# Patient Record
Sex: Female | Born: 1960 | Race: White | Hispanic: No | Marital: Married | Smoking: Never smoker
Health system: Southern US, Community
[De-identification: ages and names within clinical notes are randomized; demographics above are authoritative.]

## PROBLEM LIST (undated history)

## (undated) DIAGNOSIS — H04123 Dry eye syndrome of bilateral lacrimal glands: Secondary | ICD-10-CM

## (undated) DIAGNOSIS — E039 Hypothyroidism, unspecified: Secondary | ICD-10-CM

## (undated) DIAGNOSIS — J45909 Unspecified asthma, uncomplicated: Secondary | ICD-10-CM

## (undated) DIAGNOSIS — I10 Essential (primary) hypertension: Secondary | ICD-10-CM

## (undated) DIAGNOSIS — M1990 Unspecified osteoarthritis, unspecified site: Secondary | ICD-10-CM

## (undated) HISTORY — PX: KNEE ARTHROSCOPY: SHX127

## (undated) HISTORY — PX: JOINT REPLACEMENT: SHX530

## (undated) HISTORY — PX: BILATERAL CARPAL TUNNEL RELEASE: SHX6508

## (undated) HISTORY — PX: DILATION AND CURETTAGE OF UTERUS: SHX78

## (undated) HISTORY — PX: NOSE SURGERY: SHX723

## (undated) HISTORY — PX: LASIK: SHX215

---

## 2014-10-20 ENCOUNTER — Other Ambulatory Visit: Payer: Self-pay | Admitting: Family Medicine

## 2014-10-20 ENCOUNTER — Ambulatory Visit
Admission: RE | Admit: 2014-10-20 | Discharge: 2014-10-20 | Disposition: A | Discharge status: Home / Self Care | Payer: 59 | Source: Ambulatory Visit | Attending: Family Medicine | Admitting: Family Medicine

## 2014-10-20 DIAGNOSIS — M170 Bilateral primary osteoarthritis of knee: Secondary | ICD-10-CM

## 2015-03-14 ENCOUNTER — Other Ambulatory Visit: Payer: Self-pay

## 2015-03-14 DIAGNOSIS — Z1231 Encounter for screening mammogram for malignant neoplasm of breast: Secondary | ICD-10-CM

## 2015-04-19 ENCOUNTER — Encounter (INDEPENDENT_AMBULATORY_CARE_PROVIDER_SITE_OTHER): Payer: Self-pay

## 2015-04-19 ENCOUNTER — Ambulatory Visit
Admission: RE | Admit: 2015-04-19 | Discharge: 2015-04-19 | Disposition: A | Discharge status: Home / Self Care | Payer: 59 | Source: Ambulatory Visit

## 2015-04-19 DIAGNOSIS — Z1231 Encounter for screening mammogram for malignant neoplasm of breast: Secondary | ICD-10-CM

## 2015-08-17 ENCOUNTER — Ambulatory Visit
Admission: RE | Admit: 2015-08-17 | Discharge: 2015-08-17 | Disposition: A | Discharge status: Home / Self Care | Payer: 59 | Source: Ambulatory Visit | Attending: Rheumatology | Admitting: Rheumatology

## 2015-08-17 ENCOUNTER — Other Ambulatory Visit: Payer: Self-pay | Admitting: Rheumatology

## 2015-08-17 DIAGNOSIS — M545 Low back pain: Secondary | ICD-10-CM

## 2015-09-23 ENCOUNTER — Encounter (HOSPITAL_COMMUNITY): Payer: Self-pay

## 2015-09-23 ENCOUNTER — Emergency Department (HOSPITAL_COMMUNITY): Payer: 59

## 2015-09-23 ENCOUNTER — Emergency Department (HOSPITAL_COMMUNITY)
Admission: EM | Admit: 2015-09-23 | Discharge: 2015-09-23 | Disposition: A | Discharge status: Home / Self Care | Payer: 59 | Attending: Emergency Medicine | Admitting: Emergency Medicine

## 2015-09-23 DIAGNOSIS — Y998 Other external cause status: Secondary | ICD-10-CM | POA: Diagnosis not present

## 2015-09-23 DIAGNOSIS — Y9389 Activity, other specified: Secondary | ICD-10-CM | POA: Insufficient documentation

## 2015-09-23 DIAGNOSIS — Y9289 Other specified places as the place of occurrence of the external cause: Secondary | ICD-10-CM | POA: Diagnosis not present

## 2015-09-23 DIAGNOSIS — S8992XA Unspecified injury of left lower leg, initial encounter: Secondary | ICD-10-CM | POA: Diagnosis not present

## 2015-09-23 DIAGNOSIS — M1990 Unspecified osteoarthritis, unspecified site: Secondary | ICD-10-CM | POA: Insufficient documentation

## 2015-09-23 DIAGNOSIS — W010XXA Fall on same level from slipping, tripping and stumbling without subsequent striking against object, initial encounter: Secondary | ICD-10-CM | POA: Insufficient documentation

## 2015-09-23 DIAGNOSIS — M25562 Pain in left knee: Secondary | ICD-10-CM

## 2015-09-23 HISTORY — DX: Unspecified osteoarthritis, unspecified site: M19.90

## 2015-09-23 MED ORDER — HYDROCODONE-ACETAMINOPHEN 5-325 MG PO TABS: 1.0000 | ORAL_TABLET | ORAL | Status: DC | PRN

## 2015-09-23 NOTE — ED Notes (Signed)
She states she "slipped a little", thereby tweaking her left knee; which remains painful and swollen.  She states she has impending right knee replacement scheduled for Jan. By Dr. Lequita HaltAluisio; and will ultimately need total left knee surgery also, though no date has been set.

## 2015-09-23 NOTE — Progress Notes (Signed)
Pt stated,"I have bad knees and am having knee replacements starting in January. " Pt stated she was walking yesterday and she felt her knee make a funny noise and now has pain with ambulation.Family remains at the bedside with the pt. Pt stated her pain is a 7/10 when standing and only a 2/10 when sitting.

## 2015-09-23 NOTE — ED Provider Notes (Signed)
CSN: [id number]     Arrival date & time 09/23/15  1512 History  By signing my name below, I, Rachael Shelton, attest that this documentation has been prepared under the direction and in the presence of Marlon Pel, PA-C. Electronically Signed: Elon Shelton ED Scribe. 09/23/2015. 4:52 PM.   Chief Complaint  Patient presents with  . Knee Pain   The history is provided by the patient. No language interpreter was used.   HPI Comments: Rachael Shelton is a 54 y.o. female who presents to the Emergency Department complaining of an exacerbation of her constant, moderate, dull left knee pain yesterday after slipping forward slightly.  The complaint worsened overnight and has improved throughout the day.  She has used diclofenac and ice without relief.  She is followed by [facility], Dr. Despina Hick for her chronic knee pain and reports that she was told to come to the ED today to since Dr. Ranell Patrick is already here seeing another patient and that she might be able to get her Cortisone shot.  ROS: The patient denies diaphoresis, fever, headache, weakness (general or focal), confusion, change of vision,  dysphagia, aphagia, shortness of breath,  abdominal pains, nausea, vomiting, diarrhea,rash, neck pain, chest pain   Past Medical History  Diagnosis Date  . Arthritis    No past surgical history on file. No family history on file. Social History  Substance Use Topics  . Smoking status: Never Smoker   . Smokeless tobacco: Not on file  . Alcohol Use: No   OB History    No data available     Review of Systems 10 Systems reviewed and are negative for acute change except as noted in the HPI.    Allergies  Review of patient's allergies indicates no known allergies.  Home Medications   Prior to Admission medications   Medication Sig Start Date End Date Taking? Authorizing Provider  HYDROcodone-acetaminophen (NORCO/VICODIN) 5-325 MG tablet Take 1 tablet by mouth every 4 (four) hours as  needed. 09/23/15   Moody Robben Neva Seat, PA-C   BP 138/76 mmHg  Pulse 76  Temp(Src) 98.1 F (36.7 C) (Oral)  Ht  (1.549 m)  Wt 220 lb (99.791 kg)  BMI 41.59 kg/m2  SpO2 98%  LMP 09/20/2015 Physical Exam  Constitutional: She is oriented to person, place, and time. She appears well-developed and well-nourished. No distress.  HENT:  Head: Normocephalic and atraumatic.  Eyes: Conjunctivae and EOM are normal.  Neck: Neck supple. No tracheal deviation present.  Cardiovascular: Normal rate.   Pulmonary/Chest: Effort normal. No respiratory distress.  Musculoskeletal: Normal range of motion.       Left knee: She exhibits normal range of motion, no swelling, no effusion, no ecchymosis, no deformity, no laceration and no erythema. Tenderness found. Medial joint line and lateral joint line tenderness noted.  Neurological: She is alert and oriented to person, place, and time.  Skin: Skin is warm and dry.  Psychiatric: She has a normal mood and affect. Her behavior is normal.  Nursing note and vitals reviewed.   ED Course  Procedures (including critical care time)  COORDINATION OF CARE:  4:51 PM Discussed plan to consult orthopaedics.  Patient acknowledges and agrees with plan.    Labs Review Labs Reviewed - No data to display  Imaging Review Dg Knee Complete 4 Views Left  09/23/2015  CLINICAL DATA:  Left knee pain for 2 days. This began after walking. Leg gave out causing a slip and fall. EXAM: LEFT KNEE - COMPLETE  4+ VIEW COMPARISON:  10/20/2014 FINDINGS: No fracture.  No dislocation. There is joint space narrowing mostly of the medial compartment and to a lesser degree of the patellofemoral joint space compartment. There are prominent marginal osteophytes from all 3 compartments. No bone lesion. Small moderate joint effusion. Soft tissues unremarkable. IMPRESSION: 1. No fracture or dislocation. 2. Osteoarthritis similar to the prior study. 3. Small moderate joint effusion. Electronically  Signed   By: Amie Portlandavid  Ormond M.D.   On: 09/23/2015 16:24     EKG Interpretation None      MDM   Final diagnoses:  Left knee pain    Pt believed that she would get to see Dr. Ranell PatrickNorris here in the ED for a Cortisone shot. A curtesy call was made to Dr. Ranell PatrickNorris to make him aware that there was a miscommunication. The patient is due for a knee replacement in January and therefore a Cortisone shot is contraindicated this close due to risk of infection.  Patient is VERY pleasant and understanding but says she would NOT have come to the ER for knee pain under normal circumstances. She came because she believed she would get a cortisone shot by an orthopedic provider.  I spoke with charge and registration about patients payment for visit, they will leave it for the supervisors on Monday.  Medications - No data to display  54 y.o.[name] medical screening exam was performed and I feel the patient has had an appropriate workup for their chief complaint at this time and likelihood of emergent condition existing is low. They have been counseled on decision, discharge, follow up and which symptoms necessitate immediate return to the emergency department. They or their family verbally stated understanding and agreement with plan and discharged in stable condition.   Vital signs are stable at discharge. Filed Vitals:   09/23/15 1719  BP: 138/76  Pulse: 76  Temp: 98.1 F (36.7 C)       Marlon Peliffany Jamiyla Ishee, PA-C 09/23/15 1932  Cathren LaineKevin Steinl, MD 09/23/15 2214

## 2015-09-23 NOTE — Discharge Instructions (Signed)

## 2015-12-06 ENCOUNTER — Ambulatory Visit: Payer: Self-pay | Admitting: Orthopedic Surgery

## 2015-12-06 NOTE — Progress Notes (Signed)
Preoperative surgical orders have been place into the Epic hospital system for Rachael Shelton on 12/06/2015, 3:19 PM  by Patrica DuelPERKINS, ALEXZANDREW for surgery on 12-29-15.  Preop Total Knee orders including Experal, IV Tylenol, and IV Decadron as long as there are no contraindications to the above medications. Avel Peacerew Perkins, PA-C

## 2015-12-15 ENCOUNTER — Encounter (HOSPITAL_COMMUNITY): Payer: Self-pay

## 2015-12-15 ENCOUNTER — Encounter (HOSPITAL_COMMUNITY)
Admission: RE | Admit: 2015-12-15 | Discharge: 2015-12-15 | Disposition: A | Discharge status: Home / Self Care | Payer: 59 | Source: Ambulatory Visit | Attending: Orthopedic Surgery | Admitting: Orthopedic Surgery

## 2015-12-15 DIAGNOSIS — Z01812 Encounter for preprocedural laboratory examination: Secondary | ICD-10-CM | POA: Diagnosis present

## 2015-12-15 HISTORY — DX: Hypothyroidism, unspecified: E03.9

## 2015-12-15 HISTORY — DX: Essential (primary) hypertension: I10

## 2015-12-15 HISTORY — DX: Unspecified asthma, uncomplicated: J45.909

## 2015-12-15 HISTORY — DX: Dry eye syndrome of bilateral lacrimal glands: H04.123

## 2015-12-15 LAB — URINALYSIS, ROUTINE W REFLEX MICROSCOPIC
BILIRUBIN URINE: NEGATIVE
Glucose, UA: NEGATIVE mg/dL
HGB URINE DIPSTICK: NEGATIVE
KETONES UR: NEGATIVE mg/dL
Leukocytes, UA: NEGATIVE
NITRITE: NEGATIVE
Protein, ur: NEGATIVE mg/dL
Specific Gravity, Urine: 1.015 (ref 1.005–1.030)
pH: 6.5 (ref 5.0–8.0)

## 2015-12-15 LAB — COMPREHENSIVE METABOLIC PANEL
ALK PHOS: 99 U/L (ref 38–126)
ALT: 25 U/L (ref 14–54)
ANION GAP: 10 (ref 5–15)
AST: 25 U/L (ref 15–41)
Albumin: 3.9 g/dL (ref 3.5–5.0)
BILIRUBIN TOTAL: 0.4 mg/dL (ref 0.3–1.2)
BUN: 16 mg/dL (ref 6–20)
CALCIUM: 9.5 mg/dL (ref 8.9–10.3)
CO2: 27 mmol/L (ref 22–32)
Chloride: 106 mmol/L (ref 101–111)
Creatinine, Ser: 1.09 mg/dL — ABNORMAL HIGH (ref 0.44–1.00)
GFR, EST NON AFRICAN AMERICAN: 56 mL/min — AB (ref >60)
Glucose, Bld: 105 mg/dL — ABNORMAL HIGH (ref 65–99)
POTASSIUM: 3.8 mmol/L (ref 3.5–5.1)
Sodium: 143 mmol/L (ref 135–145)
TOTAL PROTEIN: 7.9 g/dL (ref 6.5–8.1)

## 2015-12-15 LAB — CBC
HEMATOCRIT: 37.5 % (ref 36.0–46.0)
Hemoglobin: 12.1 g/dL (ref 12.0–15.0)
MCH: 29.7 pg (ref 26.0–34.0)
MCHC: 32.3 g/dL (ref 30.0–36.0)
MCV: 92.1 fL (ref 78.0–100.0)
Platelets: 349 10*3/uL (ref 150–400)
RBC: 4.07 MIL/uL (ref 3.87–5.11)
RDW: 13.9 % (ref 11.5–15.5)
WBC: 9.2 10*3/uL (ref 4.0–10.5)

## 2015-12-15 LAB — SURGICAL PCR SCREEN
MRSA, PCR: NEGATIVE
Staphylococcus aureus: POSITIVE — AB

## 2015-12-15 LAB — PROTIME-INR
INR: 1.05 (ref 0.00–1.49)
PROTHROMBIN TIME: 13.9 s (ref 11.6–15.2)

## 2015-12-15 LAB — APTT: APTT: 32 s (ref 24–37)

## 2015-12-15 LAB — HCG, SERUM, QUALITATIVE: Preg, Serum: NEGATIVE

## 2015-12-15 NOTE — Pre-Procedure Instructions (Addendum)
EKG done today. Final EKG reviewed by Dr. Acey Lavarignan.

## 2015-12-15 NOTE — Patient Instructions (Addendum)
Rachael Shelton  12/15/2015   Your procedure is scheduled on: 12-29-15 Friday  [facility]  [facility] to 3rd floor to  Short Stay Center at 0700 AM.  Call this number if you have problems the morning of surgery [phone]   Remember: ONLY 1 PERSON MAY GO WITH YOU TO SHORT STAY TO GET  READY MORNING OF YOUR SURGERY.  Do not eat food or drink liquids :After Midnight.     Take these medicines the morning of surgery with A SIP OF WATER: Amlodipine. Cetirizine. Levothyroxine. Omeprazole. Use/ Bring -Inhalers,eye drops. DO NOT TAKE ANY DIABETIC MEDICATIONS DAY OF YOUR SURGERY                               You may not have any metal on your body including hair pins and              piercings  Do not wear jewelry, make-up, lotions, powders or perfumes, deodorant             Do not wear nail polish.  Do not shave  48 hours prior to surgery.              Men may shave face and neck.   Do not bring valuables to the hospital. [place] IS NOT             RESPONSIBLE   FOR VALUABLES.  Contacts, dentures or bridgework may not be worn into surgery.  Leave suitcase in the car. After surgery it may be brought to your room.     Patients discharged the day of surgery will not be allowed to drive home.  Name and phone number of your driver:Tom- spouse [phone] cell  Special Instructions: N/A              Please read over the following fact sheets you were given: _____________________________________________________________________             [facility] - Preparing [facility] Before surgery, you can play an important role.  Because skin is not sterile, your skin needs to be as free of germs as possible.  You can reduce the number of germs on your skin by washing with CHG (chlorahexidine gluconate) soap before surgery.  CHG is an antiseptic cleaner which kills germs and bonds with the skin to continue killing germs even after  washing. Please DO NOT use if you have an allergy to CHG or antibacterial soaps.  If your skin becomes reddened/irritated stop using the CHG and inform your nurse when you arrive at Short Stay. Do not shave (including legs and underarms) for at least 48 hours prior to the first CHG shower.  You may shave your face/neck. Please follow these instructions carefully:  1.  Shower with CHG Soap the night before surgery and the  morning of Surgery.  2.  If you choose to wash your hair, wash your hair first as usual with your  normal  shampoo.  3.  After you shampoo, rinse your hair and body thoroughly to remove the  shampoo.                           4.  Use CHG as you would any other liquid soap.  You can  apply chg directly  to the skin and wash                       Gently with a scrungie or clean washcloth.  5.  Apply the CHG Soap to your body ONLY FROM THE NECK DOWN.   Do not use on face/ open                           Wound or open sores. Avoid contact with eyes, ears mouth and genitals (private parts).                       Wash face,  Genitals (private parts) with your normal soap.             6.  Wash thoroughly, paying special attention to the area where your surgery  will be performed.  7.  Thoroughly rinse your body with warm water from the neck down.  8.  DO NOT shower/wash with your normal soap after using and rinsing off  the CHG Soap.                9.  Pat yourself dry with a clean towel.            10.  Wear clean pajamas.            11.  Place clean sheets on your bed the night of your first shower and do not  sleep with pets. Day of Surgery : Do not apply any lotions/deodorants the morning of surgery.  Please wear clean clothes to the hospital/surgery center.  FAILURE TO FOLLOW THESE INSTRUCTIONS MAY RESULT IN THE CANCELLATION OF YOUR SURGERY PATIENT SIGNATURE_________________________________  NURSE  SIGNATURE__________________________________  ________________________________________________________________________  WHAT IS A BLOOD TRANSFUSION? Blood Transfusion Information  A transfusion is the replacement of blood or some of its parts. Blood is made up of multiple cells which provide different functions.  Red blood cells carry oxygen and are used for blood loss replacement.  White blood cells fight against infection.  Platelets control bleeding.  Plasma helps clot blood.  Other blood products are available for specialized needs, such as hemophilia or other clotting disorders. BEFORE THE TRANSFUSION  Who gives blood for transfusions?   Healthy volunteers who are fully evaluated to make sure their blood is safe. This is blood bank blood. Transfusion therapy is the safest it has ever been in the practice of medicine. Before blood is taken from a donor, a complete history is taken to make sure that person has no history of diseases nor engages in risky social behavior (examples are intravenous drug use or sexual activity with multiple partners). The donor's travel history is screened to minimize risk of transmitting infections, such as malaria. The donated blood is tested for signs of infectious diseases, such as HIV and hepatitis. The blood is then tested to be sure it is compatible with you in order to minimize the chance of a transfusion reaction. If you or a relative donates blood, this is often done in anticipation of surgery and is not appropriate for emergency situations. It takes many days to process the donated blood. RISKS AND COMPLICATIONS Although transfusion therapy is very safe and saves many lives, the main dangers of transfusion include:  1. Getting an infectious disease. 2. Developing a transfusion reaction. This is an allergic reaction to something in the blood you were given. Every precaution  is taken to prevent this. The decision to have a blood transfusion has been  considered carefully by your caregiver before blood is given. Blood is not given unless the benefits outweigh the risks. AFTER THE TRANSFUSION  Right after receiving a blood transfusion, you will usually feel much better and more energetic. This is especially true if your red blood cells have gotten low (anemic). The transfusion raises the level of the red blood cells which carry oxygen, and this usually causes an energy increase.  The nurse administering the transfusion will monitor you carefully for complications. HOME CARE INSTRUCTIONS  No special instructions are needed after a transfusion. You may find your energy is better. Speak with your caregiver about any limitations on activity for underlying diseases you may have. SEEK MEDICAL CARE IF:   Your condition is not improving after your transfusion.  You develop redness or irritation at the intravenous (IV) site. SEEK IMMEDIATE MEDICAL CARE IF:  Any of the following symptoms occur over the next 12 hours:  Shaking chills.  You have a temperature by mouth above 102 F (38.9 C), not controlled by medicine.  Chest, back, or muscle pain.  People around you feel you are not acting correctly or are confused.  Shortness of breath or difficulty breathing.  Dizziness and fainting.  You get a rash or develop hives.  You have a decrease in urine output.  Your urine turns a dark color or changes to pink, red, or brown. Any of the following symptoms occur over the next 10 days:  You have a temperature by mouth above 102 F (38.9 C), not controlled by medicine.  Shortness of breath.  Weakness after normal activity.  The white part of the eye turns yellow (jaundice).  You have a decrease in the amount of urine or are urinating less often.  Your urine turns a dark color or changes to pink, red, or brown. Document Released: 11/22/2000 Document Revised: 02/17/2012 Document Reviewed: 07/11/2008 ExitCare Patient Information 2014  [place].  _______________________________________________________________________  Incentive Spirometer  An incentive spirometer is a tool that can help keep your lungs clear and active. This tool measures how well you are filling your lungs with each breath. Taking long deep breaths may help reverse or decrease the chance of developing breathing (pulmonary) problems (especially infection) following:  A long period of time when you are unable to move or be active. BEFORE THE PROCEDURE   If the spirometer includes an indicator to show your best effort, your nurse or respiratory therapist will set it to a desired goal.  If possible, sit up straight or lean slightly forward. Try not to slouch.  Hold the incentive spirometer in an upright position. INSTRUCTIONS FOR USE  3. Sit on the edge of your bed if possible, or sit up as far as you can in bed or on a chair. 4. Hold the incentive spirometer in an upright position. 5. Breathe out normally. 6. Place the mouthpiece in your mouth and seal your lips tightly around it. 7. Breathe in slowly and as deeply as possible, raising the piston or the ball toward the top of the column. 8. Hold your breath for 3-5 seconds or for as long as possible. Allow the piston or ball to fall to the bottom of the column. 9. Remove the mouthpiece from your mouth and breathe out normally. 10. Rest for a few seconds and repeat Steps 1 through 7 at least 10 times every 1-2 hours when you are awake. Take  your time and take a few normal breaths between deep breaths. 11. The spirometer may include an indicator to show your best effort. Use the indicator as a goal to work toward during each repetition. 12. After each set of 10 deep breaths, practice coughing to be sure your lungs are clear. If you have an incision (the cut made at the time of surgery), support your incision when coughing by placing a pillow or rolled up towels firmly against it. Once you are able to  get out of bed, walk around indoors and cough well. You may stop using the incentive spirometer when instructed by your caregiver.  RISKS AND COMPLICATIONS  Take your time so you do not get dizzy or light-headed.  If you are in pain, you may need to take or ask for pain medication before doing incentive spirometry. It is harder to take a deep breath if you are having pain. AFTER USE  Rest and breathe slowly and easily.  It can be helpful to keep track of a log of your progress. Your caregiver can provide you with a simple table to help with this. If you are using the spirometer at home, follow these instructions: [place] IF:   You are having difficultly using the spirometer.  You have trouble using the spirometer as often as instructed.  Your pain medication is not giving enough relief while using the spirometer.  You develop fever of 100.5 F (38.1 C) or higher. SEEK IMMEDIATE MEDICAL CARE IF:   You cough up bloody sputum that had not been present before.  You develop fever of 102 F (38.9 C) or greater.  You develop worsening pain at or near the incision site. MAKE SURE YOU:   Understand these instructions.  Will watch your condition.  Will get help right away if you are not doing well or get worse. Document Released: 04/07/2007 Document Revised: 02/17/2012 Document Reviewed: 06/08/2007 [facility] Patient Information 2014 [place], [place].   ________________________________________________________________________

## 2015-12-18 NOTE — Pre-Procedure Instructions (Signed)
12-18-15 1500 Positive Staph aureus by PCR- [facility] [phone].Pt. To use as directed.

## 2015-12-24 ENCOUNTER — Ambulatory Visit: Payer: Self-pay | Admitting: Orthopedic Surgery

## 2015-12-24 NOTE — H&P (Signed)
Rachael Shelton DOB: 28-Feb-1961 Married / Language: English / Race: White Female Date of Admission:  12/29/2015 CC:  Right Knee Pain History of Present Illness The patient is a 55 year old female who comes in for a preoperative History and Physical. The patient is scheduled for a right total knee arthroplasty to be performed by Dr. Gus RankinFrank V. Aluisio, MD at [facility] on 12-29-2015. The patient is a 55 year old female who presented for follow up of their knee. The patient is being followed for their bilateral knee pain and osteoarthritis. They are now out from cortisone injection right knee. Symptoms reported include: pain, swelling, popping and grinding. The patient feels that they are doing poorly and report their pain level to be moderate to severe. Current treatment includes: NSAIDs (Voltaren Gel). The following medication has been used for pain control: antiinflammatory medication. The patient has reported improvement of their symptoms with: Cortisone injections (relief for a couple of months). The right knee has horrific tricompartmental arthritis. She is ready to proceed with surgery at this time. They have been treated conservatively in the past for the above stated problem and despite conservative measures, they continue to have progressive pain and severe functional limitations and dysfunction. They have failed non-operative management including home exercise, medications, and injections. It is felt that they would benefit from undergoing total joint replacement. Risks and benefits of the procedure have been discussed with the patient and they elect to proceed with surgery. There are no active contraindications to surgery such as ongoing infection or rapidly progressive neurological disease.  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  Family History (Adedamola Seto L. Mansel Strother, III PA-C; 12/14/2015 3:52 PM) Chronic Obstructive Lung Disease  Mother. Osteoarthritis  Father, Maternal Grandmother, Mother. Heart Disease  Paternal Grandfather. Cancer  Father, Maternal Grandmother. Cerebrovascular Accident  Maternal Grandfather. Drug / Alcohol Addiction  Father.  Social History Current drinker  05/30/2015: Currently drinks beer, wine and hard liquor only occasionally per week Current work status  working full time Not under pain contract  Number of flights of stairs before winded  2-3 Tobacco use  Never smoker. 05/30/2015 Exercise  Exercises weekly; does running / walking and other Living situation  live with spouse No history of drug/alcohol rehab  Children  1 Marital status  married Post-Surgical Plans  Home with Husband  Medication History DULoxetine HCl (Oral) Specific strength unknown - Active. (per PCP Cymbalta generic) Vitamin D3 Specific strength unknown - Active. Voltaren (1% Gel, External) Active. Advair Diskus (Inhalation) Specific strength unknown - Active. Albuterol Sulfate (Inhalation) Specific strength unknown - Active. (prn) Levothyroxine Sodium (Oral) Specific strength unknown - Active. Losartan Potassium (Oral) Specific strength unknown - Active. Omeprazole (Oral) Specific strength unknown - Active. ZyrTEC (Oral) Specific strength unknown - Active. (prn) Omega 3 (Oral) Specific strength unknown - Active. Magnesium Gluconate (Oral) Specific strength unknown - Active. Centrum (Oral) Active. Voltaren (75MG  Tablet DR, Oral) Active.  Past Surgical History  Sinus Surgery  Straighten Nasal Septum  Carpal Tunnel Repair   bilateral Arthroscopy of Knee  right Dilation and Curettage of Uterus - Multiple  Cesarean Delivery  1 time  Review  of Systems General Not Present- Chills, Fatigue, Fever, Memory Loss, Night Sweats, Weight Gain and Weight Loss. Skin Not Present- Eczema, Hives, Itching, Lesions and Rash. HEENT Not Present- Dentures, Double Vision, Headache, Hearing Loss, Tinnitus and Visual Loss. Respiratory Not Present- Allergies, Chronic Cough, Coughing up blood, Shortness of breath at rest and Shortness of breath with exertion. Cardiovascular Not Present- Chest Pain, Difficulty Breathing Lying Down, Murmur, Palpitations, Racing/skipping heartbeats and Swelling. Gastrointestinal Not Present- Abdominal Pain, Bloody Stool, Constipation, Diarrhea, Difficulty Swallowing, Heartburn, Jaundice, Loss of appetitie, Nausea and Vomiting. Female Genitourinary Not Present- Blood in Urine, Discharge, Flank Pain, Incontinence, Painful Urination, Urgency, Urinary frequency, Urinary Retention, Urinating at Night and Weak urinary stream. Musculoskeletal Present- Joint Pain. Not Present- Back Pain, Joint Swelling, Morning Stiffness, Muscle Pain, Muscle Weakness and Spasms. Neurological Not Present- Blackout spells, Difficulty with balance, Dizziness, Paralysis, Tremor and Weakness. Psychiatric Not Present- Insomnia.  Vitals Weight: 200 lb Height: 62in Weight was reported by patient. Height was reported by patient. Body Surface Area: 1.91 m Body Mass Index: 36.58 kg/m  BP: 134/86 (Sitting, Right Arm, Standard)  Physical Exam General Mental Status -Alert, cooperative and good historian. General Appearance-pleasant, Not in acute distress. Orientation-Oriented X3. Build & Nutrition-Well nourished and Well developed.  Head and Neck Head-normocephalic, atraumatic . Neck Global Assessment - supple, no bruit auscultated on the right, no bruit auscultated on the left.  Eye Pupil - Bilateral-Regular  and Round. Motion - Bilateral-EOMI.  Chest and Lung Exam Auscultation Breath sounds - clear at anterior chest wall and clear at posterior chest wall. Adventitious sounds - No Adventitious sounds.  Cardiovascular Auscultation Rhythm - Regular rate and rhythm. Heart Sounds - S1 WNL and S2 WNL. Murmurs & Other Heart Sounds - Auscultation of the heart reveals - No Murmurs.  Abdomen Inspection Contour - Generalized moderate distention. Palpation/Percussion Tenderness - Abdomen is non-tender to palpation. Rigidity (guarding) - Abdomen is soft. Auscultation Auscultation of the abdomen reveals - Bowel sounds normal.  Female Genitourinary Note: Not done, not pertinent to present illness   Musculoskeletal Note: On exam, she is alert and oriented, in no apparent distress. Her right knee shows no effusion. Range about 5 to 120. Marked crepitus on range of motion. Diffuse tenderness and no instability. Left knee; no effusion. Range about 5 to 125. Moderate crepitus on range of motion. Significant medial and lateral tenderness with no instability noted.  Assessment & Plan Primary osteoarthritis of right knee (M17.11) Primary osteoarthritis of left knee (M17.12)  Note:Surgical Plans: Right Total Knee Replacement  Disposition: Home with Husband  PCP: Dr. Maryelizabeth Rowan - Patient has been seen preoperatively and felt to be stable for surgery.  IV TXA  Anesthesia Issues: None  Signed electronically by Lauraine Rinne, III PA-C

## 2015-12-29 ENCOUNTER — Inpatient Hospital Stay (HOSPITAL_COMMUNITY): Payer: 59 | Admitting: Certified Registered"

## 2015-12-29 ENCOUNTER — Encounter (HOSPITAL_COMMUNITY)
Admission: RE | Disposition: A | Discharge status: Home Health | Payer: Self-pay | Source: Ambulatory Visit | Attending: Orthopedic Surgery

## 2015-12-29 ENCOUNTER — Encounter (HOSPITAL_COMMUNITY): Payer: Self-pay | Admitting: *Deleted

## 2015-12-29 ENCOUNTER — Inpatient Hospital Stay (HOSPITAL_COMMUNITY)
Admission: RE | Admit: 2015-12-29 | Discharge: 2015-12-31 | DRG: 470 | Disposition: A | Discharge status: Home Health | Payer: 59 | Source: Ambulatory Visit | Attending: Orthopedic Surgery | Admitting: Orthopedic Surgery

## 2015-12-29 DIAGNOSIS — Z79899 Other long term (current) drug therapy: Secondary | ICD-10-CM | POA: Diagnosis not present

## 2015-12-29 DIAGNOSIS — J45909 Unspecified asthma, uncomplicated: Secondary | ICD-10-CM | POA: Diagnosis present

## 2015-12-29 DIAGNOSIS — M1712 Unilateral primary osteoarthritis, left knee: Secondary | ICD-10-CM | POA: Diagnosis present

## 2015-12-29 DIAGNOSIS — M25561 Pain in right knee: Secondary | ICD-10-CM | POA: Diagnosis present

## 2015-12-29 DIAGNOSIS — E039 Hypothyroidism, unspecified: Secondary | ICD-10-CM | POA: Diagnosis present

## 2015-12-29 DIAGNOSIS — M459 Ankylosing spondylitis of unspecified sites in spine: Secondary | ICD-10-CM | POA: Diagnosis present

## 2015-12-29 DIAGNOSIS — M179 Osteoarthritis of knee, unspecified: Secondary | ICD-10-CM | POA: Diagnosis present

## 2015-12-29 DIAGNOSIS — M1711 Unilateral primary osteoarthritis, right knee: Principal | ICD-10-CM | POA: Diagnosis present

## 2015-12-29 DIAGNOSIS — I10 Essential (primary) hypertension: Secondary | ICD-10-CM | POA: Diagnosis present

## 2015-12-29 DIAGNOSIS — M1710 Unilateral primary osteoarthritis, unspecified knee: Secondary | ICD-10-CM | POA: Diagnosis present

## 2015-12-29 DIAGNOSIS — M810 Age-related osteoporosis without current pathological fracture: Secondary | ICD-10-CM | POA: Diagnosis present

## 2015-12-29 HISTORY — PX: TOTAL KNEE ARTHROPLASTY: SHX125

## 2015-12-29 LAB — TYPE AND SCREEN
ABO/RH(D): O POS
ANTIBODY SCREEN: NEGATIVE

## 2015-12-29 LAB — ABO/RH: ABO/RH(D): O POS

## 2015-12-29 SURGERY — ARTHROPLASTY, KNEE, TOTAL
Anesthesia: Spinal | Laterality: Right

## 2015-12-29 MED ORDER — ALBUTEROL SULFATE (2.5 MG/3ML) 0.083% IN NEBU: 2.5000 mg | INHALATION_SOLUTION | Freq: Four times a day (QID) | RESPIRATORY_TRACT | Status: DC | PRN

## 2015-12-29 MED ORDER — BUPIVACAINE HCL 0.25 % IJ SOLN
INTRAMUSCULAR | Status: DC | PRN
  Administered 2015-12-29: 30 mL

## 2015-12-29 MED ORDER — METHOCARBAMOL 500 MG PO TABS
500.0000 mg | ORAL_TABLET | Freq: Four times a day (QID) | ORAL | Status: DC | PRN
  Administered 2015-12-30 – 2015-12-31 (×3): 500 mg via ORAL
  Filled 2015-12-29 (×3): qty 1

## 2015-12-29 MED ORDER — POTASSIUM CHLORIDE IN NACL 20-0.45 MEQ/L-% IV SOLN
INTRAVENOUS | Status: DC
  Administered 2015-12-29 – 2015-12-30 (×2): via INTRAVENOUS
  Filled 2015-12-29 (×6): qty 1000

## 2015-12-29 MED ORDER — DIPHENHYDRAMINE HCL 12.5 MG/5ML PO ELIX
12.5000 mg | ORAL_SOLUTION | ORAL | Status: DC | PRN
  Administered 2015-12-31: 12.5 mg via ORAL
  Filled 2015-12-29: qty 5

## 2015-12-29 MED ORDER — CHLORHEXIDINE GLUCONATE 4 % EX LIQD: 60.0000 mL | Freq: Once | CUTANEOUS | Status: DC

## 2015-12-29 MED ORDER — PHENYLEPHRINE HCL 10 MG/ML IJ SOLN
INTRAMUSCULAR | Status: DC | PRN
  Administered 2015-12-29 (×6): 80 ug via INTRAVENOUS

## 2015-12-29 MED ORDER — SODIUM CHLORIDE 0.9 % IJ SOLN
INTRAMUSCULAR | Status: DC | PRN
  Administered 2015-12-29: 30 mL

## 2015-12-29 MED ORDER — FENTANYL CITRATE (PF) 100 MCG/2ML IJ SOLN
INTRAMUSCULAR | Status: DC | PRN
  Administered 2015-12-29 (×2): 50 ug via INTRAVENOUS

## 2015-12-29 MED ORDER — PHENYLEPHRINE 40 MCG/ML (10ML) SYRINGE FOR IV PUSH (FOR BLOOD PRESSURE SUPPORT)
PREFILLED_SYRINGE | INTRAVENOUS | Status: AC
  Filled 2015-12-29: qty 50

## 2015-12-29 MED ORDER — PROPOFOL 500 MG/50ML IV EMUL
INTRAVENOUS | Status: DC | PRN
  Administered 2015-12-29: 100 ug/kg/min via INTRAVENOUS

## 2015-12-29 MED ORDER — PROPOFOL 10 MG/ML IV BOLUS
INTRAVENOUS | Status: AC
  Filled 2015-12-29: qty 20

## 2015-12-29 MED ORDER — BISACODYL 10 MG RE SUPP: 10.0000 mg | Freq: Every day | RECTAL | Status: DC | PRN

## 2015-12-29 MED ORDER — ONDANSETRON HCL 4 MG PO TABS: 4.0000 mg | ORAL_TABLET | Freq: Four times a day (QID) | ORAL | Status: DC | PRN

## 2015-12-29 MED ORDER — HYDROMORPHONE HCL 1 MG/ML IJ SOLN
1.0000 mg | INTRAMUSCULAR | Status: DC | PRN
  Administered 2015-12-29 – 2015-12-31 (×5): 1 mg via INTRAVENOUS
  Filled 2015-12-29 (×5): qty 1

## 2015-12-29 MED ORDER — BUPIVACAINE LIPOSOME 1.3 % IJ SUSP
20.0000 mL | Freq: Once | INTRAMUSCULAR | Status: DC
  Filled 2015-12-29: qty 20

## 2015-12-29 MED ORDER — LORATADINE 10 MG PO TABS
10.0000 mg | ORAL_TABLET | Freq: Every day | ORAL | Status: DC | PRN
  Filled 2015-12-29: qty 1

## 2015-12-29 MED ORDER — MIDAZOLAM HCL 5 MG/5ML IJ SOLN
INTRAMUSCULAR | Status: DC | PRN
  Administered 2015-12-29: 2 mg via INTRAVENOUS

## 2015-12-29 MED ORDER — FENTANYL CITRATE (PF) 100 MCG/2ML IJ SOLN
INTRAMUSCULAR | Status: AC
  Filled 2015-12-29: qty 2

## 2015-12-29 MED ORDER — ACETAMINOPHEN 650 MG RE SUPP: 650.0000 mg | Freq: Four times a day (QID) | RECTAL | Status: DC | PRN

## 2015-12-29 MED ORDER — LIDOCAINE HCL (CARDIAC) 20 MG/ML IV SOLN
INTRAVENOUS | Status: AC
  Filled 2015-12-29: qty 5

## 2015-12-29 MED ORDER — FLEET ENEMA 7-19 GM/118ML RE ENEM: 1.0000 | ENEMA | Freq: Once | RECTAL | Status: DC | PRN

## 2015-12-29 MED ORDER — CEFAZOLIN SODIUM-DEXTROSE 2-3 GM-% IV SOLR
2.0000 g | INTRAVENOUS | Status: AC
  Administered 2015-12-29: 2 g via INTRAVENOUS

## 2015-12-29 MED ORDER — ACETAMINOPHEN 500 MG PO TABS
1000.0000 mg | ORAL_TABLET | Freq: Four times a day (QID) | ORAL | Status: AC
Start: 2015-12-29 — End: 2015-12-30
  Administered 2015-12-29 – 2015-12-30 (×4): 1000 mg via ORAL
  Filled 2015-12-29 (×4): qty 2

## 2015-12-29 MED ORDER — METHOCARBAMOL 1000 MG/10ML IJ SOLN
500.0000 mg | Freq: Four times a day (QID) | INTRAVENOUS | Status: DC | PRN
  Administered 2015-12-29: 500 mg via INTRAVENOUS
  Filled 2015-12-29 (×2): qty 5

## 2015-12-29 MED ORDER — PROPOFOL 10 MG/ML IV BOLUS
INTRAVENOUS | Status: DC | PRN
  Administered 2015-12-29: 20 mg via INTRAVENOUS

## 2015-12-29 MED ORDER — PHENOL 1.4 % MT LIQD: 1.0000 | OROMUCOSAL | Status: DC | PRN

## 2015-12-29 MED ORDER — BUPIVACAINE IN DEXTROSE 0.75-8.25 % IT SOLN
INTRATHECAL | Status: DC | PRN
  Administered 2015-12-29: 1.6 mL via INTRATHECAL

## 2015-12-29 MED ORDER — MENTHOL 3 MG MT LOZG: 1.0000 | LOZENGE | OROMUCOSAL | Status: DC | PRN

## 2015-12-29 MED ORDER — DEXAMETHASONE SODIUM PHOSPHATE 10 MG/ML IJ SOLN
10.0000 mg | Freq: Once | INTRAMUSCULAR | Status: AC
  Administered 2015-12-29: 10 mg via INTRAVENOUS

## 2015-12-29 MED ORDER — ALBUTEROL SULFATE (2.5 MG/3ML) 0.083% IN NEBU: 3.0000 mL | INHALATION_SOLUTION | Freq: Four times a day (QID) | RESPIRATORY_TRACT | Status: DC | PRN

## 2015-12-29 MED ORDER — METOCLOPRAMIDE HCL 10 MG PO TABS: 5.0000 mg | ORAL_TABLET | Freq: Three times a day (TID) | ORAL | Status: DC | PRN

## 2015-12-29 MED ORDER — ACETAMINOPHEN 325 MG PO TABS
650.0000 mg | ORAL_TABLET | Freq: Four times a day (QID) | ORAL | Status: DC | PRN
Start: 2015-12-30 — End: 2015-12-31
  Administered 2015-12-30 – 2015-12-31 (×3): 650 mg via ORAL
  Filled 2015-12-29 (×4): qty 2

## 2015-12-29 MED ORDER — DULOXETINE HCL 30 MG PO CPEP
30.0000 mg | ORAL_CAPSULE | Freq: Every day | ORAL | Status: DC
  Administered 2015-12-30: 30 mg via ORAL
  Filled 2015-12-29 (×2): qty 1

## 2015-12-29 MED ORDER — PANTOPRAZOLE SODIUM 40 MG PO TBEC
40.0000 mg | DELAYED_RELEASE_TABLET | Freq: Every day | ORAL | Status: DC
  Administered 2015-12-30 – 2015-12-31 (×2): 40 mg via ORAL
  Filled 2015-12-29 (×2): qty 1

## 2015-12-29 MED ORDER — RIVAROXABAN 10 MG PO TABS
10.0000 mg | ORAL_TABLET | Freq: Every day | ORAL | Status: DC
  Administered 2015-12-30 – 2015-12-31 (×2): 10 mg via ORAL
  Filled 2015-12-29 (×3): qty 1

## 2015-12-29 MED ORDER — OXYCODONE HCL 5 MG PO TABS
5.0000 mg | ORAL_TABLET | ORAL | Status: DC | PRN
  Administered 2015-12-29 – 2015-12-31 (×10): 10 mg via ORAL
  Filled 2015-12-29 (×11): qty 2

## 2015-12-29 MED ORDER — MIDAZOLAM HCL 2 MG/2ML IJ SOLN
INTRAMUSCULAR | Status: AC
  Filled 2015-12-29: qty 2

## 2015-12-29 MED ORDER — LACTATED RINGERS IV SOLN
INTRAVENOUS | Status: DC
  Administered 2015-12-29: 11:00:00 via INTRAVENOUS
  Administered 2015-12-29: 1000 mL via INTRAVENOUS

## 2015-12-29 MED ORDER — MEPERIDINE HCL 50 MG/ML IJ SOLN: 6.2500 mg | INTRAMUSCULAR | Status: DC | PRN

## 2015-12-29 MED ORDER — LIDOCAINE HCL (CARDIAC) 20 MG/ML IV SOLN
INTRAVENOUS | Status: DC | PRN
  Administered 2015-12-29: 40 mg via INTRAVENOUS

## 2015-12-29 MED ORDER — BUPIVACAINE HCL (PF) 0.25 % IJ SOLN
INTRAMUSCULAR | Status: AC
  Filled 2015-12-29: qty 30

## 2015-12-29 MED ORDER — POLYETHYLENE GLYCOL 3350 17 G PO PACK: 17.0000 g | PACK | Freq: Every day | ORAL | Status: DC | PRN

## 2015-12-29 MED ORDER — SODIUM CHLORIDE 0.9 % IV SOLN: INTRAVENOUS | Status: DC

## 2015-12-29 MED ORDER — LACTATED RINGERS IV SOLN: INTRAVENOUS | Status: DC

## 2015-12-29 MED ORDER — METOCLOPRAMIDE HCL 5 MG/ML IJ SOLN: 5.0000 mg | Freq: Three times a day (TID) | INTRAMUSCULAR | Status: DC | PRN

## 2015-12-29 MED ORDER — ONDANSETRON HCL 4 MG/2ML IJ SOLN
INTRAMUSCULAR | Status: AC
  Filled 2015-12-29: qty 2

## 2015-12-29 MED ORDER — CEFAZOLIN SODIUM-DEXTROSE 2-3 GM-% IV SOLR
INTRAVENOUS | Status: AC
  Filled 2015-12-29: qty 50

## 2015-12-29 MED ORDER — LOSARTAN POTASSIUM 50 MG PO TABS
100.0000 mg | ORAL_TABLET | Freq: Every day | ORAL | Status: DC
  Administered 2015-12-30 – 2015-12-31 (×2): 100 mg via ORAL
  Filled 2015-12-29 (×2): qty 2

## 2015-12-29 MED ORDER — CEFAZOLIN SODIUM-DEXTROSE 2-3 GM-% IV SOLR
2.0000 g | Freq: Four times a day (QID) | INTRAVENOUS | Status: AC
  Administered 2015-12-29 (×2): 2 g via INTRAVENOUS
  Filled 2015-12-29 (×2): qty 50

## 2015-12-29 MED ORDER — DEXAMETHASONE SODIUM PHOSPHATE 10 MG/ML IJ SOLN
10.0000 mg | Freq: Once | INTRAMUSCULAR | Status: AC
  Administered 2015-12-30: 10 mg via INTRAVENOUS
  Filled 2015-12-29: qty 1

## 2015-12-29 MED ORDER — SODIUM CHLORIDE 0.9 % IJ SOLN
INTRAMUSCULAR | Status: AC
  Filled 2015-12-29: qty 50

## 2015-12-29 MED ORDER — MOMETASONE FURO-FORMOTEROL FUM 100-5 MCG/ACT IN AERO
2.0000 | INHALATION_SPRAY | Freq: Two times a day (BID) | RESPIRATORY_TRACT | Status: DC
  Administered 2015-12-30: 2 via RESPIRATORY_TRACT
  Filled 2015-12-29: qty 8.8

## 2015-12-29 MED ORDER — ACETAMINOPHEN 10 MG/ML IV SOLN
1000.0000 mg | Freq: Once | INTRAVENOUS | Status: AC
  Administered 2015-12-29: 1000 mg via INTRAVENOUS

## 2015-12-29 MED ORDER — ONDANSETRON HCL 4 MG/2ML IJ SOLN
INTRAMUSCULAR | Status: DC | PRN
  Administered 2015-12-29: 4 mg via INTRAVENOUS

## 2015-12-29 MED ORDER — TRANEXAMIC ACID 1000 MG/10ML IV SOLN
1000.0000 mg | INTRAVENOUS | Status: AC
  Administered 2015-12-29: 1000 mg via INTRAVENOUS
  Filled 2015-12-29: qty 10

## 2015-12-29 MED ORDER — HYDROMORPHONE HCL 1 MG/ML IJ SOLN: 0.2500 mg | INTRAMUSCULAR | Status: DC | PRN

## 2015-12-29 MED ORDER — LEVOTHYROXINE SODIUM 100 MCG PO TABS
100.0000 ug | ORAL_TABLET | Freq: Every day | ORAL | Status: DC
  Administered 2015-12-30 – 2015-12-31 (×2): 100 ug via ORAL
  Filled 2015-12-29 (×3): qty 1

## 2015-12-29 MED ORDER — MORPHINE SULFATE (PF) 2 MG/ML IV SOLN
1.0000 mg | INTRAVENOUS | Status: DC | PRN
  Administered 2015-12-29 (×2): 1 mg via INTRAVENOUS
  Filled 2015-12-29 (×2): qty 1

## 2015-12-29 MED ORDER — ONDANSETRON HCL 4 MG/2ML IJ SOLN: 4.0000 mg | Freq: Four times a day (QID) | INTRAMUSCULAR | Status: DC | PRN

## 2015-12-29 MED ORDER — DOCUSATE SODIUM 100 MG PO CAPS
100.0000 mg | ORAL_CAPSULE | Freq: Two times a day (BID) | ORAL | Status: DC
  Administered 2015-12-29 – 2015-12-31 (×4): 100 mg via ORAL

## 2015-12-29 MED ORDER — ACETAMINOPHEN 10 MG/ML IV SOLN
INTRAVENOUS | Status: AC
  Filled 2015-12-29: qty 100

## 2015-12-29 MED ORDER — BUPIVACAINE LIPOSOME 1.3 % IJ SUSP
INTRAMUSCULAR | Status: DC | PRN
  Administered 2015-12-29: 20 mL

## 2015-12-29 MED ORDER — DEXAMETHASONE SODIUM PHOSPHATE 10 MG/ML IJ SOLN
INTRAMUSCULAR | Status: AC
  Filled 2015-12-29: qty 1

## 2015-12-29 MED ORDER — PROMETHAZINE HCL 25 MG/ML IJ SOLN: 6.2500 mg | INTRAMUSCULAR | Status: DC | PRN

## 2015-12-29 MED ORDER — AMLODIPINE BESYLATE 5 MG PO TABS
5.0000 mg | ORAL_TABLET | Freq: Every day | ORAL | Status: DC
  Administered 2015-12-30 – 2015-12-31 (×2): 5 mg via ORAL
  Filled 2015-12-29 (×2): qty 1

## 2015-12-29 SURGICAL SUPPLY — 50 items
BAG DECANTER FOR FLEXI CONT (MISCELLANEOUS) ×3 IMPLANT
BAG ZIPLOCK 12X15 (MISCELLANEOUS) ×3 IMPLANT
BANDAGE ACE 6X5 VEL STRL LF (GAUZE/BANDAGES/DRESSINGS) ×3 IMPLANT
BLADE SAG 18X100X1.27 (BLADE) ×3 IMPLANT
BLADE SAW SGTL 11.0X1.19X90.0M (BLADE) ×3 IMPLANT
BOWL SMART MIX CTS (DISPOSABLE) ×3 IMPLANT
CAPT KNEE TOTAL 3 ATTUNE ×3 IMPLANT
CEMENT HV SMART SET (Cement) ×6 IMPLANT
CLOSURE WOUND 1/2 X4 (GAUZE/BANDAGES/DRESSINGS) ×1
CLOTH BEACON ORANGE TIMEOUT ST (SAFETY) ×3 IMPLANT
CUFF TOURN SGL QUICK 34 (TOURNIQUET CUFF) ×2
CUFF TRNQT CYL 34X4X40X1 (TOURNIQUET CUFF) ×1 IMPLANT
DECANTER SPIKE VIAL GLASS SM (MISCELLANEOUS) ×3 IMPLANT
DRAPE U-SHAPE 47X51 STRL (DRAPES) ×3 IMPLANT
DRSG ADAPTIC 3X8 NADH LF (GAUZE/BANDAGES/DRESSINGS) ×3 IMPLANT
DRSG PAD ABDOMINAL 8X10 ST (GAUZE/BANDAGES/DRESSINGS) ×3 IMPLANT
DURAPREP 26ML APPLICATOR (WOUND CARE) ×3 IMPLANT
ELECT REM PT RETURN 9FT ADLT (ELECTROSURGICAL) ×3
ELECTRODE REM PT RTRN 9FT ADLT (ELECTROSURGICAL) ×1 IMPLANT
EVACUATOR 1/8 PVC DRAIN (DRAIN) ×3 IMPLANT
GAUZE SPONGE 4X4 12PLY STRL (GAUZE/BANDAGES/DRESSINGS) ×3 IMPLANT
GLOVE BIO SURGEON STRL SZ7.5 (GLOVE) IMPLANT
GLOVE BIO SURGEON STRL SZ8 (GLOVE) ×3 IMPLANT
GLOVE BIOGEL PI IND STRL 6.5 (GLOVE) IMPLANT
GLOVE BIOGEL PI IND STRL 8 (GLOVE) ×1 IMPLANT
GLOVE BIOGEL PI INDICATOR 6.5 (GLOVE)
GLOVE BIOGEL PI INDICATOR 8 (GLOVE) ×2
GLOVE SURG SS PI 6.5 STRL IVOR (GLOVE) IMPLANT
GOWN STRL REUS W/TWL LRG LVL3 (GOWN DISPOSABLE) ×3 IMPLANT
GOWN STRL REUS W/TWL XL LVL3 (GOWN DISPOSABLE) IMPLANT
HANDPIECE INTERPULSE COAX TIP (DISPOSABLE) ×2
IMMOBILIZER KNEE 20 (SOFTGOODS) ×3
IMMOBILIZER KNEE 20 THIGH 36 (SOFTGOODS) ×1 IMPLANT
MANIFOLD NEPTUNE II (INSTRUMENTS) ×3 IMPLANT
NS IRRIG 1000ML POUR BTL (IV SOLUTION) ×3 IMPLANT
PACK TOTAL KNEE CUSTOM (KITS) ×3 IMPLANT
PADDING CAST COTTON 6X4 STRL (CAST SUPPLIES) ×6 IMPLANT
POSITIONER SURGICAL ARM (MISCELLANEOUS) ×3 IMPLANT
SET HNDPC FAN SPRY TIP SCT (DISPOSABLE) ×1 IMPLANT
STRIP CLOSURE SKIN 1/2X4 (GAUZE/BANDAGES/DRESSINGS) ×2 IMPLANT
SUT MNCRL AB 4-0 PS2 18 (SUTURE) ×3 IMPLANT
SUT VIC AB 2-0 CT1 27 (SUTURE) ×8
SUT VIC AB 2-0 CT1 TAPERPNT 27 (SUTURE) ×4 IMPLANT
SUT VLOC 180 0 24IN GS25 (SUTURE) ×3 IMPLANT
SYR 50ML LL SCALE MARK (SYRINGE) ×3 IMPLANT
TRAY FOLEY W/METER SILVER 14FR (SET/KITS/TRAYS/PACK) ×3 IMPLANT
TRAY FOLEY W/METER SILVER 16FR (SET/KITS/TRAYS/PACK) IMPLANT
WATER STERILE IRR 1500ML POUR (IV SOLUTION) ×3 IMPLANT
WRAP KNEE MAXI GEL POST OP (GAUZE/BANDAGES/DRESSINGS) ×3 IMPLANT
YANKAUER SUCT BULB TIP 10FT TU (MISCELLANEOUS) ×3 IMPLANT

## 2015-12-29 NOTE — H&P (View-Only) (Signed)
Rachael Shelton DOB: 28-Feb-1961 Married / Language: English / Race: White Female Date of Admission:  12/29/2015 CC:  Right Knee Pain History of Present Illness The patient is a 55 year old female who comes in for a preoperative History and Physical. The patient is scheduled for a right total knee arthroplasty to be performed by Dr. Gus RankinFrank V. Aluisio, MD at [facility] on 12-29-2015. The patient is a 55 year old female who presented for follow up of their knee. The patient is being followed for their bilateral knee pain and osteoarthritis. They are now out from cortisone injection right knee. Symptoms reported include: pain, swelling, popping and grinding. The patient feels that they are doing poorly and report their pain level to be moderate to severe. Current treatment includes: NSAIDs (Voltaren Gel). The following medication has been used for pain control: antiinflammatory medication. The patient has reported improvement of their symptoms with: Cortisone injections (relief for a couple of months). The right knee has horrific tricompartmental arthritis. She is ready to proceed with surgery at this time. They have been treated conservatively in the past for the above stated problem and despite conservative measures, they continue to have progressive pain and severe functional limitations and dysfunction. They have failed non-operative management including home exercise, medications, and injections. It is felt that they would benefit from undergoing total joint replacement. Risks and benefits of the procedure have been discussed with the patient and they elect to proceed with surgery. There are no active contraindications to surgery such as ongoing infection or rapidly progressive neurological disease.  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  Family History (Alexzandrew L. Perkins, III PA-C; 12/14/2015 3:52 PM) Chronic Obstructive Lung Disease  Mother. Osteoarthritis  Father, Maternal Grandmother, Mother. Heart Disease  Paternal Grandfather. Cancer  Father, Maternal Grandmother. Cerebrovascular Accident  Maternal Grandfather. Drug / Alcohol Addiction  Father.  Social History Current drinker  05/30/2015: Currently drinks beer, wine and hard liquor only occasionally per week Current work status  working full time Not under pain contract  Number of flights of stairs before winded  2-3 Tobacco use  Never smoker. 05/30/2015 Exercise  Exercises weekly; does running / walking and other Living situation  live with spouse No history of drug/alcohol rehab  Children  1 Marital status  married Post-Surgical Plans  Home with Husband  Medication History DULoxetine HCl (Oral) Specific strength unknown - Active. (per PCP Cymbalta generic) Vitamin D3 Specific strength unknown - Active. Voltaren (1% Gel, External) Active. Advair Diskus (Inhalation) Specific strength unknown - Active. Albuterol Sulfate (Inhalation) Specific strength unknown - Active. (prn) Levothyroxine Sodium (Oral) Specific strength unknown - Active. Losartan Potassium (Oral) Specific strength unknown - Active. Omeprazole (Oral) Specific strength unknown - Active. ZyrTEC (Oral) Specific strength unknown - Active. (prn) Omega 3 (Oral) Specific strength unknown - Active. Magnesium Gluconate (Oral) Specific strength unknown - Active. Centrum (Oral) Active. Voltaren (75MG  Tablet DR, Oral) Active.  Past Surgical History  Sinus Surgery  Straighten Nasal Septum  Carpal Tunnel Repair   bilateral Arthroscopy of Knee  right Dilation and Curettage of Uterus - Multiple  Cesarean Delivery  1 time  Review  of Systems General Not Present- Chills, Fatigue, Fever, Memory Loss, Night Sweats, Weight Gain and Weight Loss. Skin Not Present- Eczema, Hives, Itching, Lesions and Rash. HEENT Not Present- Dentures, Double Vision, Headache, Hearing Loss, Tinnitus and Visual Loss. Respiratory Not Present- Allergies, Chronic Cough, Coughing up blood, Shortness of breath at rest and Shortness of breath with exertion. Cardiovascular Not Present- Chest Pain, Difficulty Breathing Lying Down, Murmur, Palpitations, Racing/skipping heartbeats and Swelling. Gastrointestinal Not Present- Abdominal Pain, Bloody Stool, Constipation, Diarrhea, Difficulty Swallowing, Heartburn, Jaundice, Loss of appetitie, Nausea and Vomiting. Female Genitourinary Not Present- Blood in Urine, Discharge, Flank Pain, Incontinence, Painful Urination, Urgency, Urinary frequency, Urinary Retention, Urinating at Night and Weak urinary stream. Musculoskeletal Present- Joint Pain. Not Present- Back Pain, Joint Swelling, Morning Stiffness, Muscle Pain, Muscle Weakness and Spasms. Neurological Not Present- Blackout spells, Difficulty with balance, Dizziness, Paralysis, Tremor and Weakness. Psychiatric Not Present- Insomnia.  Vitals Weight: 200 lb Height: 62in Weight was reported by patient. Height was reported by patient. Body Surface Area: 1.91 m Body Mass Index: 36.58 kg/m  BP: 134/86 (Sitting, Right Arm, Standard)  Physical Exam General Mental Status -Alert, cooperative and good historian. General Appearance-pleasant, Not in acute distress. Orientation-Oriented X3. Build & Nutrition-Well nourished and Well developed.  Head and Neck Head-normocephalic, atraumatic . Neck Global Assessment - supple, no bruit auscultated on the right, no bruit auscultated on the left.  Eye Pupil - Bilateral-Regular  and Round. Motion - Bilateral-EOMI.  Chest and Lung Exam Auscultation Breath sounds - clear at anterior chest wall and clear at posterior chest wall. Adventitious sounds - No Adventitious sounds.  Cardiovascular Auscultation Rhythm - Regular rate and rhythm. Heart Sounds - S1 WNL and S2 WNL. Murmurs & Other Heart Sounds - Auscultation of the heart reveals - No Murmurs.  Abdomen Inspection Contour - Generalized moderate distention. Palpation/Percussion Tenderness - Abdomen is non-tender to palpation. Rigidity (guarding) - Abdomen is soft. Auscultation Auscultation of the abdomen reveals - Bowel sounds normal.  Female Genitourinary Note: Not done, not pertinent to present illness   Musculoskeletal Note: On exam, she is alert and oriented, in no apparent distress. Her right knee shows no effusion. Range about 5 to 120. Marked crepitus on range of motion. Diffuse tenderness and no instability. Left knee; no effusion. Range about 5 to 125. Moderate crepitus on range of motion. Significant medial and lateral tenderness with no instability noted.  Assessment & Plan Primary osteoarthritis of right knee (M17.11) Primary osteoarthritis of left knee (M17.12)  Note:Surgical Plans: Right Total Knee Replacement  Disposition: Home with Husband  PCP: Dr. Maryelizabeth Rowan - Patient has been seen preoperatively and felt to be stable for surgery.  IV TXA  Anesthesia Issues: None  Signed electronically by Lauraine Rinne, III PA-C

## 2015-12-29 NOTE — Anesthesia Postprocedure Evaluation (Signed)
Anesthesia Post Note  Patient: Rachael Shelton  Procedure(s) Performed: Procedure(s) (LRB): TOTAL KNEE ARTHROPLASTY (Right)  Patient location during evaluation: PACU Anesthesia Type: Spinal Level of consciousness: oriented and awake and alert Pain management: pain level controlled Vital Signs Assessment: post-procedure vital signs reviewed and stable Respiratory status: spontaneous breathing, respiratory function stable and patient connected to nasal cannula oxygen Cardiovascular status: blood pressure returned to baseline and stable Postop Assessment: no headache, no backache and spinal receding Anesthetic complications: no    Last Vitals:  Filed Vitals:   12/29/15 1230 12/29/15 1255  BP: 104/68 126/70  Pulse: 87 85  Temp: 36.4 C 36.7 C  Resp: 15 16    Last Pain:  Filed Vitals:   12/29/15 1257  PainSc: 0-No pain                 Shelton Silvas

## 2015-12-29 NOTE — Interval H&P Note (Signed)
History and Physical Interval Note:  12/29/2015 9:02 AM  Rachael Shelton  has presented today for surgery, with the diagnosis of RIGHT KNEE OA  The various methods of treatment have been discussed with the patient and family. After consideration of risks, benefits and other options for treatment, the patient has consented to  Procedure(s): TOTAL KNEE ARTHROPLASTY (Right) as a surgical intervention .  The patient's history has been reviewed, patient examined, no change in status, stable for surgery.  I have reviewed the patient's chart and labs.  Questions were answered to the patient's satisfaction.     Loanne Drilling

## 2015-12-29 NOTE — Op Note (Signed)
Pre-operative diagnosis- Osteoarthritis  Right knee(s)  Post-operative diagnosis- Osteoarthritis Right knee(s)  Procedure-  Right  Total Knee Arthroplasty  Surgeon- Gus Rankin. Jermaine Tholl, MD  Assistant- Avel Peace, PA-C   Anesthesia-  Spinal  EBL-* No blood loss amount entered *   Drains Hemovac  Tourniquet time-  Total Tourniquet Time Documented: Thigh (Right) - 36 minutes Total: Thigh (Right) - 36 minutes     Complications- None  Condition-PACU - hemodynamically stable.   Brief Clinical Note  Rachael Shelton is a 55 y.o. year old female with end stage OA of her right knee with progressively worsening pain and dysfunction. She has constant pain, with activity and at rest and significant functional deficits with difficulties even with ADLs. She has had extensive non-op management including analgesics, injections of cortisone and viscosupplements, and home exercise program, but remains in significant pain with significant dysfunction.Radiographs show bone on bone arthritis medial and patellofemoral. She presents now for right Total Knee Arthroplasty.    Procedure in detail---   The patient is brought into the operating room and positioned supine on the operating table. After successful administration of  Spinal,   a tourniquet is placed high on the  Right thigh(s) and the lower extremity is prepped and draped in the usual sterile fashion. Time out is performed by the operating team and then the  Right lower extremity is wrapped in Esmarch, knee flexed and the tourniquet inflated to 300 mmHg.       A midline incision is made with a ten blade through the subcutaneous tissue to the level of the extensor mechanism. A fresh blade is used to make a medial parapatellar arthrotomy. Soft tissue over the proximal medial tibia is subperiosteally elevated to the joint line with a knife and into the semimembranosus bursa with a Cobb elevator. Soft tissue over the proximal lateral tibia is elevated  with attention being paid to avoiding the patellar tendon on the tibial tubercle. The patella is everted, knee flexed 90 degrees and the ACL and PCL are removed. Findings are bone on bone all 3 compartments with massive global osteophytes.        The drill is used to create a starting hole in the distal femur and the canal is thoroughly irrigated with sterile saline to remove the fatty contents. The 5 degree Right  valgus alignment guide is placed into the femoral canal and the distal femoral cutting block is pinned to remove 9 mm off the distal femur. Resection is made with an oscillating saw.      The tibia is subluxed forward and the menisci are removed. The extramedullary alignment guide is placed referencing proximally at the medial aspect of the tibial tubercle and distally along the second metatarsal axis and tibial crest. The block is pinned to remove 2mm off the more deficient medial  side. Resection is made with an oscillating saw. Size 4is the most appropriate size for the tibia and the proximal tibia is prepared with the modular drill and keel punch for that size.      The femoral sizing guide is placed and size 5 is most appropriate. Rotation is marked off the epicondylar axis and confirmed by creating a rectangular flexion gap at 90 degrees. The size 5 cutting block is pinned in this rotation and the anterior, posterior and chamfer cuts are made with the oscillating saw. The intercondylar block is then placed and that cut is made.      Trial size 4 tibial component, trial  size 5 narrow posterior stabilized femur and a 8  mm posterior stabilized rotating platform insert trial is placed. Full extension is achieved with excellent varus/valgus and anterior/posterior balance throughout full range of motion. The patella is everted and thickness measured to be 22  mm. Free hand resection is taken to 12 mm, a 35 template is placed, lug holes are drilled, trial patella is placed, and it tracks normally.  Osteophytes are removed off the posterior femur with the trial in place. All trials are removed and the cut bone surfaces prepared with pulsatile lavage. Cement is mixed and once ready for implantation, the size 4 tibial implant, size  5 narrow posterior stabilized femoral component, and the size 35 patella are cemented in place and the patella is held with the clamp. The trial insert is placed and the knee held in full extension. The Exparel (20 ml mixed with 30 ml saline) and .25% Bupivicaine, are injected into the extensor mechanism, posterior capsule, medial and lateral gutters and subcutaneous tissues.  All extruded cement is removed and once the cement is hard the permanent 8 mm posterior stabilized rotating platform insert is placed into the tibial tray.      The wound is copiously irrigated with saline solution and the extensor mechanism closed over a hemovac drain with #1 V-loc suture. The tourniquet is released for a total tourniquet time of 36  minutes. Flexion against gravity is 140 degrees and the patella tracks normally. Subcutaneous tissue is closed with 2.0 vicryl and subcuticular with running 4.0 Monocryl. The incision is cleaned and dried and steri-strips and a bulky sterile dressing are applied. The limb is placed into a knee immobilizer and the patient is awakened and transported to recovery in stable condition.      Please note that a surgical assistant was a medical necessity for this procedure in order to perform it in a safe and expeditious manner. Surgical assistant was necessary to retract the ligaments and vital neurovascular structures to prevent injury to them and also necessary for proper positioning of the limb to allow for anatomic placement of the prosthesis.   Gus Rankin Kosisochukwu Goldberg, MD    12/29/2015, 11:06 AM

## 2015-12-29 NOTE — Progress Notes (Signed)
Rachael Shelton on 6th, notified pt will be in 1616 in 20 minutes:  Needs pump and pulsox.

## 2015-12-29 NOTE — Anesthesia Preprocedure Evaluation (Addendum)
Anesthesia Evaluation  Patient identified by MRN, date of birth, ID band Patient awake    Reviewed: Allergy & Precautions, NPO status , Patient's Chart, lab work & pertinent test results  Airway Mallampati: II  TM Distance: >3 FB Neck ROM: Full    Dental  (+) Teeth Intact   Pulmonary asthma ,    breath sounds clear to auscultation       Cardiovascular hypertension, Pt. on medications  Rhythm:Regular Rate:Normal     Neuro/Psych negative neurological ROS  negative psych ROS   GI/Hepatic negative GI ROS, Neg liver ROS,   Endo/Other  Hypothyroidism   Renal/GU negative Renal ROS  negative genitourinary   Musculoskeletal  (+) Arthritis , Osteoarthritis,    Abdominal   Peds negative pediatric ROS (+)  Hematology negative hematology ROS (+)   Anesthesia Other Findings   Reproductive/Obstetrics negative OB ROS                            Lab Results  Component Value Date   WBC 9.2 12/15/2015   HGB 12.1 12/15/2015   HCT 37.5 12/15/2015   MCV 92.1 12/15/2015   PLT 349 12/15/2015   Lab Results  Component Value Date   INR 1.05 12/15/2015     Anesthesia Physical Anesthesia Plan  ASA: II  Anesthesia Plan: Spinal   Post-op Pain Management:    Induction:   Airway Management Planned:   Additional Equipment:   Intra-op Plan:   Post-operative Plan:   Informed Consent: I have reviewed the patients History and Physical, chart, labs and discussed the procedure including the risks, benefits and alternatives for the proposed anesthesia with the patient or authorized representative who has indicated his/her understanding and acceptance.     Plan Discussed with: CRNA  Anesthesia Plan Comments:         Anesthesia Quick Evaluation

## 2015-12-29 NOTE — Transfer of Care (Signed)
Immediate Anesthesia Transfer of Care Note  Patient: Rachael Shelton  Procedure(s) Performed: Procedure(s): TOTAL KNEE ARTHROPLASTY (Right)  Patient Location: PACU  Anesthesia Type:Spinal  Level of Consciousness: awake, alert , oriented and patient cooperative  Airway & Oxygen Therapy: Patient Spontanous Breathing and Patient connected to face mask oxygen  Post-op Assessment: Report given to RN, Post -op Vital signs reviewed and stable and SAB level L1.  Post vital signs: Reviewed and stable  Last Vitals:  Filed Vitals:   12/29/15 0706  BP: 138/84  Pulse: 100  Temp: 36.6 C  Resp: 18    Complications: No apparent anesthesia complications

## 2015-12-29 NOTE — Progress Notes (Signed)
Physical Therapy Evaluation Patient Details Name: Rachael Shelton MRN: [id number] DOB: 1961-09-07 Today's Date: 12/29/2015   History of Present Illness  R TKR  Clinical Impression  Pt s/p R TKR presents with decreased R LE strength/ROM and post op pain limiting functional mobility.  Pt should progress to dc home with family assist and HHPT follow up.    Follow Up Recommendations Home health PT    Equipment Recommendations  None recommended by PT    Recommendations for Other Services OT consult     Precautions / Restrictions Precautions Precautions: Knee;Fall Required Braces or Orthoses: Knee Immobilizer - Right Knee Immobilizer - Right: Discontinue once straight leg raise with < 10 degree lag Restrictions Weight Bearing Restrictions: No Other Position/Activity Restrictions: WBAT      Mobility  Bed Mobility Overal bed mobility: Needs Assistance Bed Mobility: Supine to Sit     Supine to sit: Min assist     General bed mobility comments: cues for sequence and use of L LE to self assist.  Pt utilizing bed rails to self assist  Transfers Overall transfer level: Needs assistance Equipment used: Rolling walker (2 wheeled) Transfers: Sit to/from Stand Sit to Stand: Min assist         General transfer comment: cues for LE management and use of UEs to self assist  Ambulation/Gait Ambulation/Gait assistance: Min assist Ambulation Distance (Feet): 54 Feet Assistive device: Rolling walker (2 wheeled) Gait Pattern/deviations: Step-to pattern;Decreased step length - right;Decreased step length - left;Shuffle;Trunk flexed Gait velocity: decr Gait velocity interpretation: Below normal speed for age/gender General Gait Details: cues for sequence, posture and position from [employer]            Wheelchair Mobility    Modified Rankin (Stroke Patients Only)       Balance                                             Pertinent Vitals/Pain  Pain Assessment: 0-10 Pain Score: 5  Pain Location: R knee Pain Descriptors / Indicators: Aching;Sore Pain Intervention(s): Limited activity within patient's tolerance;Monitored during session;Premedicated before session;Ice applied    Home Living Family/patient expects to be discharged to:: Private residence Living Arrangements: Spouse/significant other Available Help at Discharge: Family Type of Home: House Home Access: Stairs to enter   Secretary/administrator of Steps: 1 Home Layout: Two level Home Equipment: Environmental consultant - 2 wheels;Cane - single point;Crutches      Prior Function Level of Independence: Independent;Independent with assistive device(s)               Hand Dominance        Extremity/Trunk Assessment   Upper Extremity Assessment: Overall WFL for tasks assessed           Lower Extremity Assessment: RLE deficits/detail      Cervical / Trunk Assessment: Normal  Communication   Communication: No difficulties  Cognition Arousal/Alertness: Awake/alert Behavior During Therapy: WFL for tasks assessed/performed Overall Cognitive Status: Within Functional Limits for tasks assessed                      General Comments      Exercises        Assessment/Plan    PT Assessment Patient needs continued PT services  PT Diagnosis Difficulty walking   PT Problem List Decreased strength;Decreased range of motion;Decreased activity  tolerance;Decreased mobility;Decreased knowledge of use of DME;Pain;Obesity  PT Treatment Interventions DME instruction;Gait training;Stair training;Functional mobility training;Therapeutic activities;Therapeutic exercise;Patient/family education   PT Goals (Current goals can be found in the Care Plan section) Acute Rehab PT Goals Patient Stated Goal: Resume previous lifestyle with decreased pain PT Goal Formulation: With patient Time For Goal Achievement: 01/02/16 Potential to Achieve Goals: Good    Frequency 7X/week    Barriers to discharge        Co-evaluation               End of Session Equipment Utilized During Treatment: Gait belt;Right knee immobilizer Activity Tolerance: Patient tolerated treatment well Patient left: in chair;with call bell/phone within reach;with family/visitor present Nurse Communication: Mobility status         Time: 1635-1700 PT Time Calculation (min) (ACUTE ONLY): 25 min   Charges:   PT Evaluation $PT Eval Low Complexity: 1 Procedure PT Treatments $Gait Training: 8-22 mins   PT G Codes:        Rachael Shelton January 21, 2016, 6:06 PM

## 2015-12-29 NOTE — Discharge Instructions (Signed)
° °Dr. Frank Aluisio °Total Joint Specialist °[facility] °[address] °, [place] [address] °[phone] ° °TOTAL KNEE REPLACEMENT POSTOPERATIVE DIRECTIONS ° °Knee Rehabilitation, Guidelines Following Surgery  °Results after knee surgery are often greatly improved when you follow the exercise, range of motion and muscle strengthening exercises prescribed by your doctor. Safety measures are also important to protect the knee from further injury. Any time any of these exercises cause you to have increased pain or swelling in your knee joint, decrease the amount until you are comfortable again and slowly increase them. If you have problems or questions, call your caregiver or physical therapist for advice.  ° °HOME CARE INSTRUCTIONS  °Remove items at home which could result in a fall. This includes throw rugs or furniture in walking pathways.  °· ICE to the affected knee every three hours for 30 minutes at a time and then as needed for pain and swelling.  Continue to use ice on the knee for pain and swelling from surgery. You may notice swelling that will progress down to the foot and ankle.  This is normal after surgery.  Elevate the leg when you are not up walking on it.   °· Continue to use the breathing machine which will help keep your temperature down.  It is common for your temperature to cycle up and down following surgery, especially at night when you are not up moving around and exerting yourself.  The breathing machine keeps your lungs expanded and your temperature down. °· Do not place pillow under knee, focus on keeping the knee straight while resting ° °DIET °You may resume your previous home diet once your are discharged from the hospital. ° °DRESSING / WOUND CARE / SHOWERING °You may start showering once you are discharged home but do not submerge the incision under water. Just pat the incision dry and apply a dry gauze dressing on daily. °Change the surgical dressing  daily and reapply a dry dressing each time. ° °ACTIVITY °Walk with your walker as instructed. °Use walker as long as suggested by your caregivers. °Avoid periods of inactivity such as sitting longer than an hour when not asleep. This helps prevent blood clots.  °You may resume a sexual relationship in one month or when given the OK by your doctor.  °You may return to work once you are cleared by your doctor.  °Do not drive a car for 6 weeks or until released by you surgeon.  °Do not drive while taking narcotics. ° °WEIGHT BEARING °Weight bearing as tolerated with assist device (walker, cane, etc) as directed, use it as long as suggested by your surgeon or therapist, typically at least 4-6 weeks. ° °POSTOPERATIVE CONSTIPATION PROTOCOL °Constipation - defined medically as fewer than three stools per week and severe constipation as less than one stool per week. ° °One of the most common issues patients have following surgery is constipation.  Even if you have a regular bowel pattern at home, your normal regimen is likely to be disrupted due to multiple reasons following surgery.  Combination of anesthesia, postoperative narcotics, change in appetite and fluid intake all can affect your bowels.  In order to avoid complications following surgery, here are some recommendations in order to help you during your recovery period. ° °Colace (docusate) - Pick up an over-the-counter form of Colace or another stool softener and take twice a day as long as you are requiring postoperative pain medications.  Take with a full glass of water   daily.  If you experience loose stools or diarrhea, hold the colace until you stool forms back up.  If your symptoms do not get better within 1 week or if they get worse, check with your doctor. ° °Dulcolax (bisacodyl) - Pick up over-the-counter and take as directed by the product packaging as needed to assist with the movement of your bowels.  Take with a full glass of water.  Use this product as  needed if not relieved by Colace only.  ° °MiraLax (polyethylene glycol) - Pick up over-the-counter to have on hand.  MiraLax is a solution that will increase the amount of water in your bowels to assist with bowel movements.  Take as directed and can mix with a glass of water, juice, soda, coffee, or tea.  Take if you go more than two days without a movement. °Do not use MiraLax more than once per day. Call your doctor if you are still constipated or irregular after using this medication for 7 days in a row. ° °If you continue to have problems with postoperative constipation, please contact the office for further assistance and recommendations.  If you experience "the worst abdominal pain ever" or develop nausea or vomiting, please contact the office immediatly for further recommendations for treatment. ° °ITCHING ° If you experience itching with your medications, try taking only a single pain pill, or even half a pain pill at a time.  You can also use Benadryl over the counter for itching or also to help with sleep.  ° °TED HOSE STOCKINGS °Wear the elastic stockings on both legs for three weeks following surgery during the day but you may remove then at night for sleeping. ° °MEDICATIONS °See your medication summary on the “After Visit Summary” that the nursing staff will review with you prior to discharge.  You may have some home medications which will be placed on hold until you complete the course of blood thinner medication.  It is important for you to complete the blood thinner medication as prescribed by your surgeon.  Continue your approved medications as instructed at time of discharge. ° °PRECAUTIONS °If you experience chest pain or shortness of breath - call 911 immediately for transfer to the hospital emergency department.  °If you develop a fever greater that 101 F, purulent drainage from wound, increased redness or drainage from wound, foul odor from the wound/dressing, or calf pain - CONTACT YOUR  SURGEON.   °                                                °FOLLOW-UP APPOINTMENTS °Make sure you keep all of your appointments after your operation with your surgeon and caregivers. You should call the office at the above phone number and make an appointment for approximately two weeks after the date of your surgery or on the date instructed by your surgeon outlined in the "After Visit Summary". ° ° °RANGE OF MOTION AND STRENGTHENING EXERCISES  °Rehabilitation of the knee is important following a knee injury or an operation. After just a few days of immobilization, the muscles of the thigh which control the knee become weakened and shrink (atrophy). Knee exercises are designed to build up the tone and strength of the thigh muscles and to improve knee motion. Often times heat used for twenty to thirty minutes before working out will loosen   up your tissues and help with improving the range of motion but do not use heat for the first two weeks following surgery. These exercises can be done on a training (exercise) mat, on the floor, on a table or on a bed. Use what ever works the best and is most comfortable for you Knee exercises include:  Leg Lifts - While your knee is still immobilized in a splint or cast, you can do straight leg raises. Lift the leg to 60 degrees, hold for 3 sec, and slowly lower the leg. Repeat 10-20 times 2-3 times daily. Perform this exercise against resistance later as your knee gets better.  Quad and Hamstring Sets - Tighten up the muscle on the front of the thigh (Quad) and hold for 5-10 sec. Repeat this 10-20 times hourly. Hamstring sets are done by pushing the foot backward against an object and holding for 5-10 sec. Repeat as with quad sets.   Leg Slides: Lying on your back, slowly slide your foot toward your buttocks, bending your knee up off the floor (only go as far as is comfortable). Then slowly slide your foot back down until your leg is flat on the floor again.  Angel Wings:  Lying on your back spread your legs to the side as far apart as you can without causing discomfort.  A rehabilitation program following serious knee injuries can speed recovery and prevent re-injury in the future due to weakened muscles. Contact your doctor or a physical therapist for more information on knee rehabilitation.   IF YOU ARE TRANSFERRED TO A SKILLED REHAB FACILITY If the patient is transferred to a skilled rehab facility following release from the hospital, a list of the current medications will be sent to the facility for the patient to continue.  When discharged from the skilled rehab facility, please have the facility set up the patient's Home Health Physical Therapy prior to being released. Also, the skilled facility will be responsible for providing the patient with their medications at time of release from the facility to include their pain medication, the muscle relaxants, and their blood thinner medication. If the patient is still at the rehab facility at time of the two week follow up appointment, the skilled rehab facility will also need to assist the patient in arranging follow up appointment in our office and any transportation needs.  MAKE SURE YOU:  Understand these instructions.  Get help right away if you are not doing well or get worse.    Pick up stool softner and laxative for home use following surgery while on pain medications. Do not submerge incision under water. Please use good hand washing techniques while changing dressing each day. May shower starting three days after surgery. Please use a clean towel to pat the incision dry following showers. Continue to use ice for pain and swelling after surgery. Do not use any lotions or creams on the incision until instructed by your surgeon. ============================================================================================================ Information on my medicine - XARELTO (Rivaroxaban)  This medication  education was reviewed with me or my healthcare representative as part of my discharge preparation.  The pharmacist that spoke with me during my hospital stay was:  Ruthanna Macchia, Ky Barban, RPH  Why was Xarelto prescribed for you? Xarelto was prescribed for you to reduce the risk of blood clots forming after orthopedic surgery. The medical term for these abnormal blood clots is venous thromboembolism (VTE).  What do you need to know about xarelto ? Take your Xarelto ONCE DAILY at  the same time every day. You may take it either with or without food.  If you have difficulty swallowing the tablet whole, you may crush it and mix in applesauce just prior to taking your dose.  Take Xarelto exactly as prescribed by your doctor and DO NOT stop taking Xarelto without talking to the doctor who prescribed the medication.  Stopping without other VTE prevention medication to take the place of Xarelto may increase your risk of developing a clot.  After discharge, you should have regular check-up appointments with your healthcare provider that is prescribing your Xarelto.    What do you do if you miss a dose? If you miss a dose, take it as soon as you remember on the same day then continue your regularly scheduled once daily regimen the next day. Do not take two doses of Xarelto on the same day.   Important Safety Information A possible side effect of Xarelto is bleeding. You should call your healthcare provider right away if you experience any of the following: ? Bleeding from an injury or your nose that does not stop. ? Unusual colored urine (red or dark brown) or unusual colored stools (red or black). ? Unusual bruising for unknown reasons. ? A serious fall or if you hit your head (even if there is no bleeding).  Some medicines may interact with Xarelto and might increase your risk of bleeding while on Xarelto. To help avoid this, consult your healthcare provider or pharmacist prior to using any  new prescription or non-prescription medications, including herbals, vitamins, non-steroidal anti-inflammatory drugs (NSAIDs) and supplements.  This website has more information on Xarelto: [email or web address].br.

## 2015-12-30 LAB — CBC
HCT: 36.2 % (ref 36.0–46.0)
HEMOGLOBIN: 11.8 g/dL — AB (ref 12.0–15.0)
MCH: 30 pg (ref 26.0–34.0)
MCHC: 32.6 g/dL (ref 30.0–36.0)
MCV: 92.1 fL (ref 78.0–100.0)
Platelets: 268 10*3/uL (ref 150–400)
RBC: 3.93 MIL/uL (ref 3.87–5.11)
RDW: 14.1 % (ref 11.5–15.5)
WBC: 18 10*3/uL — AB (ref 4.0–10.5)

## 2015-12-30 LAB — BASIC METABOLIC PANEL
Anion gap: 9 (ref 5–15)
BUN: 12 mg/dL (ref 6–20)
CHLORIDE: 105 mmol/L (ref 101–111)
CO2: 23 mmol/L (ref 22–32)
CREATININE: 0.8 mg/dL (ref 0.44–1.00)
Calcium: 8.6 mg/dL — ABNORMAL LOW (ref 8.9–10.3)
GFR calc non Af Amer: >60 mL/min (ref >60)
Glucose, Bld: 192 mg/dL — ABNORMAL HIGH (ref 65–99)
POTASSIUM: 4.5 mmol/L (ref 3.5–5.1)
SODIUM: 137 mmol/L (ref 135–145)

## 2015-12-30 MED ORDER — RIVAROXABAN 10 MG PO TABS: 10.0000 mg | ORAL_TABLET | Freq: Every day | ORAL | Status: DC

## 2015-12-30 MED ORDER — OXYCODONE HCL 5 MG PO TABS: 5.0000 mg | ORAL_TABLET | ORAL | Status: DC | PRN

## 2015-12-30 MED ORDER — METHOCARBAMOL 500 MG PO TABS: 500.0000 mg | ORAL_TABLET | Freq: Four times a day (QID) | ORAL | Status: DC | PRN

## 2015-12-30 NOTE — Progress Notes (Signed)
   Subjective: 1 Day Post-Op Procedure(s) (LRB): TOTAL KNEE ARTHROPLASTY (Right) Patient reports pain as moderate.   Plan is to go Home after hospital stay.  Objective: Vital signs in last 24 hours: Temp:  [97.6 F (36.4 C)-98.9 F (37.2 C)] 98 F (36.7 C) (01/21 0512) Pulse Rate:  [84-102] 89 (01/21 0512) Resp:  [12-21] 16 (01/21 0512) BP: (91-159)/(57-91) 140/87 mmHg (01/21 0512) SpO2:  [95 %-100 %] 97 % (01/21 0512) Weight:  [103.874 kg (229 lb)] 103.874 kg (229 lb) (01/20 0801)  Intake/Output from previous day:  Intake/Output Summary (Last 24 hours) at 12/30/15 0747 Last data filed at 12/30/15 0513  Gross per 24 hour  Intake   3485 ml  Output   3135 ml  Net    350 ml    Intake/Output this shift:    Labs:  Recent Labs  12/30/15 0454  HGB 11.8*    Recent Labs  12/30/15 0454  WBC 18.0*  RBC 3.93  HCT 36.2  PLT 268    Recent Labs  12/30/15 0454  NA 137  K 4.5  CL 105  CO2 23  BUN 12  CREATININE 0.80  GLUCOSE 192*  CALCIUM 8.6*   No results for input(s): LABPT, INR in the last 72 hours.  EXAM General - Patient is Alert, Appropriate and Oriented Extremity - Neurologically intact Neurovascular intact No cellulitis present Compartment soft Dressing - dressing C/D/I Motor Function - intact, moving foot and toes well on exam.  Hemovac pulled without difficulty.  Past Medical History  Diagnosis Date  . Hypothyroidism   . Asthma     environmental elements-seasonal alleries/ asthma  . Hypertension   . Bilateral dry eyes     tx. OTC  med used  . Arthritis     osteoarthritis- knees, ankylosing spondylosis back    Assessment/Plan: 1 Day Post-Op Procedure(s) (LRB): TOTAL KNEE ARTHROPLASTY (Right) Principal Problem:   OA (osteoarthritis) of knee   Advance diet Up with therapy D/C IV fluids Plan for discharge tomorrow  DVT Prophylaxis - Xarelto Weight-Bearing as tolerated to right leg   Pollyanna Levay V 12/30/2015, 7:47 AM

## 2015-12-30 NOTE — Progress Notes (Signed)
Physical Therapy Treatment Patient Details Name: Rachael Shelton MRN: [id number] DOB: February 12, 1961 Today's Date: 12/30/2015    History of Present Illness R TKR    PT Comments    Pt very motivated, progressing well with mobility and hopeful for dc home tomorrow.  Follow Up Recommendations  Home health PT     Equipment Recommendations  None recommended by PT    Recommendations for Other Services OT consult     Precautions / Restrictions Precautions Precautions: Knee;Fall Required Braces or Orthoses: Knee Immobilizer - Right Knee Immobilizer - Right: Discontinue once straight leg raise with < 10 degree lag Restrictions Weight Bearing Restrictions: No Other Position/Activity Restrictions: WBAT    Mobility  Bed Mobility Overal bed mobility: Needs Assistance Bed Mobility: Sit to Supine       Sit to supine: Min guard   General bed mobility comments: cues for sequence and use of L LE to self assist.   Transfers Overall transfer level: Needs assistance Equipment used: Rolling walker (2 wheeled) Transfers: Sit to/from Stand Sit to Stand: Min guard         General transfer comment: Cues for LE management and use of UEs to self assist  Ambulation/Gait Ambulation/Gait assistance: Min guard Ambulation Distance (Feet): 123 Feet (and 20' from bathroom) Assistive device: Rolling walker (2 wheeled) Gait Pattern/deviations: Step-to pattern;Step-through pattern;Decreased step length - right;Decreased step length - left;Shuffle;Trunk flexed Gait velocity: decr   General Gait Details: min cues for sequence, posture and position from RW   Stairs Stairs: Yes Stairs assistance: Min assist Stair Management: No rails;Backwards;Forwards;Step to pattern;With walker Number of Stairs: 3 General stair comments: single step fwd once and bkwd twice.  Cues for sequence and foot/RW placement  Wheelchair Mobility    Modified Rankin (Stroke Patients Only)       Balance                                     Cognition Arousal/Alertness: Awake/alert Behavior During Therapy: WFL for tasks assessed/performed Overall Cognitive Status: Within Functional Limits for tasks assessed                      Exercises      General Comments        Pertinent Vitals/Pain Pain Assessment: 0-10 Pain Score: 5  Pain Location: R knee Pain Descriptors / Indicators: Aching;Sore Pain Intervention(s): Limited activity within patient's tolerance;Monitored during session;Premedicated before session;Ice applied    Home Living                      Prior Function            PT Goals (current goals can now be found in the care plan section) Acute Rehab PT Goals Patient Stated Goal: Resume previous lifestyle with decreased pain PT Goal Formulation: With patient Time For Goal Achievement: 01/02/16 Potential to Achieve Goals: Good Progress towards PT goals: Progressing toward goals    Frequency  7X/week    PT Plan Current plan remains appropriate    Co-evaluation             End of Session Equipment Utilized During Treatment: Gait belt;Right knee immobilizer Activity Tolerance: Patient tolerated treatment well Patient left: in bed;with call bell/phone within reach     Time: 1317-1350 PT Time Calculation (min) (ACUTE ONLY): 33 min  Charges:  $Gait Training: 8-22 mins $Therapeutic Activity: 8-22  mins                    G Codes:      Rachael Shelton 2016-01-03, 4:31 PM

## 2015-12-30 NOTE — Evaluation (Signed)
Occupational Therapy Evaluation Patient Details Name: Rachael Shelton MRN: [id number] DOB: 1961/01/18 Today's Date: 12/30/2015    History of Present Illness R TKR   Clinical Impression   Pt was independent in self care and ambulating with a cane as needed prior to admission. Presents with post operative pain and impaired balance.  Pt educated in AE and LB bathing and dressing techniques, multiple uses of 3 in1. Will follow to address toilet transfer and shower transfers.  Pt will rely on her husband to assist with LB ADL.   Follow Up Recommendations  No OT follow up;Supervision/Assistance - 24 hour    Equipment Recommendations  None recommended by OT    Recommendations for Other Services       Precautions / Restrictions Precautions Precautions: Knee;Fall Required Braces or Orthoses: Knee Immobilizer - Right Knee Immobilizer - Right: Discontinue once straight leg raise with < 10 degree lag Restrictions Weight Bearing Restrictions: No Other Position/Activity Restrictions: WBAT      Mobility Bed Mobility                  Transfers Overall transfer level: Needs assistance Equipment used: Rolling walker (2 wheeled) Transfers: Sit to/from Stand Sit to Stand: Min guard         General transfer comment: from chair    Balance                                            ADL Overall ADL's : Needs assistance/impaired Eating/Feeding: Independent;Sitting   Grooming: Wash/dry hands;Wash/dry face;Brushing hair;Sitting;Set up   Upper Body Bathing: Set up;Sitting   Lower Body Bathing: Sit to/from stand;Minimal assistance Lower Body Bathing Details (indicate cue type and reason): recommended long bath sponge Upper Body Dressing : Set up;Sitting   Lower Body Dressing: Minimal assistance;Sit to/from stand Lower Body Dressing Details (indicate cue type and reason): instructed to dress operated leg first, undress last, educated in use of AE Toilet  Transfer: Min guard;RW   Toileting- Architect and Hygiene: Min guard;Sit to/from Nurse, children's Details (indicate cue type and reason): verbally instructed in technique for shower transfer and use of 3 in 1 as shower seat Functional mobility during ADLs: Min guard       Vision     Perception     Praxis      Pertinent Vitals/Pain Pain Assessment: Faces Faces Pain Scale: Hurts little more Pain Location: R knee Pain Descriptors / Indicators: Operative site guarding;Sore Pain Intervention(s): Limited activity within patient's tolerance;Monitored during session;Premedicated before session;Repositioned;Ice applied     Hand Dominance Right   Extremity/Trunk Assessment Upper Extremity Assessment Upper Extremity Assessment: Overall WFL for tasks assessed   Lower Extremity Assessment Lower Extremity Assessment: Defer to PT evaluation   Cervical / Trunk Assessment Cervical / Trunk Assessment: Normal   Communication Communication Communication: No difficulties   Cognition Arousal/Alertness: Awake/alert Behavior During Therapy: WFL for tasks assessed/performed Overall Cognitive Status: Within Functional Limits for tasks assessed                     General Comments       Exercises       Shoulder Instructions      Home Living Family/patient expects to be discharged to:: Private residence Living Arrangements: Spouse/significant other Available Help at Discharge: Family Type of Home: House Home Access: Stairs  to enter Entrance Stairs-Number of Steps: 1   Home Layout: Two level Alternate Level Stairs-Number of Steps: 14 Alternate Level Stairs-Rails: Right Bathroom Shower/Tub: Producer, television/film/video: Standard     Home Equipment: Environmental consultant - 2 wheels;Cane - single point;Crutches;Bedside commode   Additional Comments: can borrow RW and BSC from neighbor      Prior Functioning/Environment Level of Independence:  Independent;Independent with assistive device(s)        Comments: sometimes used cane    OT Diagnosis: Generalized weakness;Acute pain   OT Problem List: Decreased strength;Decreased activity tolerance;Impaired balance (sitting and/or standing);Decreased knowledge of use of DME or AE;Pain   OT Treatment/Interventions: Self-care/ADL training;DME and/or AE instruction;Patient/family education    OT Goals(Current goals can be found in the care plan section) Acute Rehab OT Goals Patient Stated Goal: Resume previous lifestyle with decreased pain OT Goal Formulation: With patient Time For Goal Achievement: 01/06/16 Potential to Achieve Goals: Good ADL Goals Pt Will Transfer to Toilet: with supervision;ambulating;bedside commode (over toilet) Pt Will Perform Tub/Shower Transfer: Shower transfer;with min guard assist;ambulating;3 in 1;rolling walker  OT Frequency: Min 2X/week   Barriers to D/C:            Co-evaluation              End of Session Equipment Utilized During Treatment: Rolling walker;Right knee immobilizer  Activity Tolerance: Patient tolerated treatment well Patient left: in chair;with call bell/phone within reach;with nursing/sitter in room   Time: 0913-0950 OT Time Calculation (min): 37 min Charges:  OT General Charges $OT Visit: 1 Procedure OT Evaluation $OT Eval Moderate Complexity: 1 Procedure OT Treatments $Self Care/Home Management : 8-22 mins G-Codes:    Evern Bio 12/30/2015, 10:01 AM  [phone]

## 2015-12-30 NOTE — Progress Notes (Signed)
Physical Therapy Treatment Patient Details Name: Rachael Shelton MRN: [id number] DOB: 03/07/1961 Today's Date: 12/30/2015    History of Present Illness R TKR    PT Comments    Pt progressing well with mobility and hopeful for dc tomorrow.  Follow Up Recommendations  Home health PT     Equipment Recommendations  None recommended by PT    Recommendations for Other Services OT consult     Precautions / Restrictions Precautions Precautions: Knee;Fall Required Braces or Orthoses: Knee Immobilizer - Right Knee Immobilizer - Right: Discontinue once straight leg raise with < 10 degree lag Restrictions Weight Bearing Restrictions: No Other Position/Activity Restrictions: WBAT    Mobility  Bed Mobility Overal bed mobility: Needs Assistance Bed Mobility: Supine to Sit     Supine to sit: Min assist     General bed mobility comments: cues for sequence and use of L LE to self assist.   Transfers Overall transfer level: Needs assistance Equipment used: Rolling walker (2 wheeled) Transfers: Sit to/from Stand Sit to Stand: Min guard         General transfer comment: Cues for LE management and use of UEs to self assist  Ambulation/Gait Ambulation/Gait assistance: Min assist;Min guard Ambulation Distance (Feet): 98 Feet Assistive device: Rolling walker (2 wheeled) Gait Pattern/deviations: Step-to pattern;Decreased step length - right;Decreased step length - left;Shuffle;Trunk flexed Gait velocity: decr   General Gait Details: cues for sequence, posture and position from [employer]            Wheelchair Mobility    Modified Rankin (Stroke Patients Only)       Balance                                    Cognition Arousal/Alertness: Awake/alert Behavior During Therapy: WFL for tasks assessed/performed Overall Cognitive Status: Within Functional Limits for tasks assessed                      Exercises Total Joint Exercises Ankle  Circles/Pumps: AROM;Both;15 reps;Supine Quad Sets: AROM;Both;10 reps;Supine Heel Slides: AAROM;Right;15 reps;Supine Straight Leg Raises: AAROM;Right;10 reps;Supine Goniometric ROM: AAROM R knee -10 - 45    General Comments        Pertinent Vitals/Pain Pain Assessment: 0-10 Pain Score: 5  Faces Pain Scale: Hurts little more Pain Location: R knee Pain Descriptors / Indicators: Aching;Sore Pain Intervention(s): Limited activity within patient's tolerance;Monitored during session;Premedicated before session;Ice applied    Home Living Family/patient expects to be discharged to:: Private residence Living Arrangements: Spouse/significant other Available Help at Discharge: Family Type of Home: House Home Access: Stairs to enter   Home Layout: Two level Home Equipment: Environmental consultant - 2 wheels;Cane - single point;Crutches;Bedside commode Additional Comments: can borrow RW and BSC from neighbor    Prior Function Level of Independence: Independent;Independent with assistive device(s)      Comments: sometimes used cane   PT Goals (current goals can now be found in the care plan section) Acute Rehab PT Goals Patient Stated Goal: Resume previous lifestyle with decreased pain PT Goal Formulation: With patient Time For Goal Achievement: 01/02/16 Potential to Achieve Goals: Good Progress towards PT goals: Progressing toward goals    Frequency  7X/week    PT Plan      Co-evaluation             End of Session Equipment Utilized During Treatment: Gait belt;Right knee immobilizer  Activity Tolerance: Patient tolerated treatment well Patient left: in chair;with call bell/phone within reach     Time: 0820-0900 PT Time Calculation (min) (ACUTE ONLY): 40 min  Charges:  $Gait Training: 8-22 mins $Therapeutic Exercise: 23-37 mins                    G Codes:      Rachael Shelton January 07, 2016, 12:23 PM

## 2015-12-31 LAB — CBC
HEMATOCRIT: 33.5 % — AB (ref 36.0–46.0)
Hemoglobin: 10.8 g/dL — ABNORMAL LOW (ref 12.0–15.0)
MCH: 29.9 pg (ref 26.0–34.0)
MCHC: 32.2 g/dL (ref 30.0–36.0)
MCV: 92.8 fL (ref 78.0–100.0)
Platelets: 298 10*3/uL (ref 150–400)
RBC: 3.61 MIL/uL — ABNORMAL LOW (ref 3.87–5.11)
RDW: 14.7 % (ref 11.5–15.5)
WBC: 19.3 10*3/uL — AB (ref 4.0–10.5)

## 2015-12-31 LAB — BASIC METABOLIC PANEL
ANION GAP: 9 (ref 5–15)
BUN: 13 mg/dL (ref 6–20)
CHLORIDE: 103 mmol/L (ref 101–111)
CO2: 28 mmol/L (ref 22–32)
Calcium: 8.7 mg/dL — ABNORMAL LOW (ref 8.9–10.3)
Creatinine, Ser: 0.89 mg/dL (ref 0.44–1.00)
GFR calc Af Amer: >60 mL/min (ref >60)
GFR calc non Af Amer: >60 mL/min (ref >60)
GLUCOSE: 138 mg/dL — AB (ref 65–99)
POTASSIUM: 4.1 mmol/L (ref 3.5–5.1)
Sodium: 140 mmol/L (ref 135–145)

## 2015-12-31 NOTE — Progress Notes (Signed)
Occupational Therapy Treatment Patient Details Name: Rachael Shelton MRN: [id number] DOB: February 15, 1961 Today's Date: 12/31/2015    History of present illness R TKR   OT comments  Pt able to ambulate to the bathroom and stand to groom with supervision.  Reviewed technique for shower transfer and instructed in safety and fall prevention.  Pt with no other questions or concerns. Eager to go home today.  Follow Up Recommendations  No OT follow up;Supervision/Assistance - 24 hour    Equipment Recommendations  None recommended by OT    Recommendations for Other Services      Precautions / Restrictions Precautions Precautions: Knee;Fall Required Braces or Orthoses:  (DCed KI, pt able to do SLR) Restrictions Weight Bearing Restrictions: No Other Position/Activity Restrictions: WBAT       Mobility Bed Mobility Pt in chair  Transfers Overall transfer level: Needs assistance Equipment used: Rolling walker (2 wheeled)   Sit to Stand: Supervision         General transfer comment: Cues for LE management    Balance                                   ADL Overall ADL's : Needs assistance/impaired     Grooming: Wash/dry hands;Standing;Supervision/safety                   Toilet Transfer: Financial risk analyst Details (indicate cue type and reason): BSC over toilet Toileting- Clothing Manipulation and Hygiene: Supervision/safety;Sit to/from Nurse, children's Details (indicate cue type and reason): reviewed technique with pt as she observed, reminded pt to use 3 in 1 as shower seat and to have supervision of her spouse, pt verbalizing understanding. Functional mobility during ADLs: Supervision/safety;Rolling walker General ADL Comments: Educated in safe footwear and in transporting items safely with her walker.      Vision                     Perception     Praxis      Cognition   Behavior  During Therapy: WFL for tasks assessed/performed Overall Cognitive Status: Within Functional Limits for tasks assessed                       Extremity/Trunk Assessment               Exercises    Shoulder Instructions       General Comments      Pertinent Vitals/ Pain       Pain Assessment: Faces Pain Score: 3  Faces Pain Scale: Hurts a little bit Pain Location: R knee Pain Descriptors / Indicators: Sore Pain Intervention(s): Monitored during session;Premedicated before session;Repositioned;Ice applied  Home Living                                          Prior Functioning/Environment              Frequency Min 2X/week     Progress Toward Goals  OT Goals(current goals can now be found in the care plan section)     Acute Rehab OT Goals Patient Stated Goal: to walk [place] the beach  Plan Discharge plan remains appropriate    Co-evaluation  End of Session Equipment Utilized During Treatment: Rolling walker;Gait belt   Activity Tolerance Patient tolerated treatment well   Patient Left in chair;with call bell/phone within reach   Nurse Communication          Time: 4098-1191 OT Time Calculation (min): 19 min  Charges: OT General Charges $OT Visit: 1 Procedure OT Treatments $Self Care/Home Management : 8-22 mins  Evern Bio 12/31/2015, 10:18 AM  [phone]

## 2015-12-31 NOTE — Care Management Note (Signed)
Case Management Note  Patient Details  Name: [name] MRN: [id number] Date of Birth: September 22, 1961  Subjective/Objective:                  TOTAL KNEE ARTHROPLASTY (Right) Action/Plan: Discharge planning Expected Discharge Date:  12/31/15               Expected Discharge Plan:  Home w Home Health Services  In-House Referral:     Discharge planning Services  CM Consult  Post Acute Care Choice:    Choice offered to:  Patient  DME Arranged:    DME Agency:     HH Arranged:  PT HH Agency:  [facility]  Status of Service:  Completed, signed off  Medicare Important Message Given:    Date Medicare IM Given:    Medicare IM give by:    Date Additional Medicare IM Given:    Additional Medicare Important Message give by:     If discussed at Long Length of Stay Meetings, dates discussed:    Additional Comments: CM spoke with pt who  states she has a neighbor's rolling walker and does not need any other DME. Pt chooses [facility] to render HHPT.  Referral emailed to [employer], Tim.  No other CM needs were communicated.  Yves Dill, RN 12/31/2015, 11:18 AM

## 2015-12-31 NOTE — Progress Notes (Signed)
Physical Therapy Treatment Patient Details Name: Rachael Shelton MRN: [id number] DOB: November 26, 1961 Today's Date: 12/31/2015    History of Present Illness R TKR    PT Comments    Pt ambulated 200' with RW, completed stair training, and demonstrates understanding of TKA exercises. She has met PT goals and is ready to DC home from PT standpoint.   Follow Up Recommendations  Home health PT     Equipment Recommendations  None recommended by PT    Recommendations for Other Services OT consult     Precautions / Restrictions Precautions Precautions: Knee;Fall Required Braces or Orthoses:  (DCed KI, pt able to do SLR) Restrictions Weight Bearing Restrictions: No Other Position/Activity Restrictions: WBAT    Mobility  Bed Mobility Overal bed mobility: Needs Assistance Bed Mobility: Supine to Sit     Supine to sit: Supervision     General bed mobility comments: verbal cues to self assist RLE with LLE  Transfers Overall transfer level: Needs assistance Equipment used: Rolling walker (2 wheeled)   Sit to Stand: Supervision         General transfer comment: Cues for LE management  Ambulation/Gait Ambulation/Gait assistance: Modified independent (Device/Increase time) Ambulation Distance (Feet): 200 Feet Assistive device: Rolling walker (2 wheeled)   Gait velocity: decr   General Gait Details: good sequencing, no LOB, VCs to flex R knee during swing phase   Stairs Stairs: Yes Stairs assistance: Supervision Stair Management: One rail Left;Step to pattern;Forwards Number of Stairs: 3 General stair comments: good sequencing, steady   Wheelchair Mobility    Modified Rankin (Stroke Patients Only)       Balance                                    Cognition Arousal/Alertness: Awake/alert Behavior During Therapy: WFL for tasks assessed/performed Overall Cognitive Status: Within Functional Limits for tasks assessed                       Exercises Total Joint Exercises Ankle Circles/Pumps: AROM;Both;15 reps;Supine Quad Sets: AROM;Both;10 reps;Supine Short Arc Quad: AAROM;Right;15 reps Heel Slides: AAROM;Right;15 reps;Supine Straight Leg Raises: AAROM;Right;10 reps;Supine;AROM Goniometric ROM: 10-45* AAROM    General Comments        Pertinent Vitals/Pain Pain Score: 3  Pain Location: R knee Pain Descriptors / Indicators: Sore Pain Intervention(s): Monitored during session;Limited activity within patient's tolerance;Premedicated before session;Ice applied    Home Living                      Prior Function            PT Goals (current goals can now be found in the care plan section) Acute Rehab PT Goals Patient Stated Goal: to walk [place] the beach PT Goal Formulation: With patient Time For Goal Achievement: 01/02/16 Potential to Achieve Goals: Good Progress towards PT goals: Goals met/education completed, patient discharged from PT    Frequency  7X/week    PT Plan Current plan remains appropriate    Co-evaluation             End of Session Equipment Utilized During Treatment: Gait belt Activity Tolerance: Patient tolerated treatment well Patient left: with call bell/phone within reach;in chair     Time: 0822-0850 PT Time Calculation (min) (ACUTE ONLY): 28 min  Charges:  $Gait Training: 8-22 mins $Therapeutic Exercise: 8-22 mins  G Codes:      Philomena Doheny 12/31/2015, 8:56 AM [phone]

## 2015-12-31 NOTE — Progress Notes (Signed)
Reviewed Discharge instructions with patient and spouse. They verbalized understanding and had no further questions/ concerns at this time. IV dc's. Dressing on right knee is clean, dry, and intact. Pt to be discharged home with spouse.

## 2015-12-31 NOTE — Progress Notes (Signed)
   Subjective: 2 Days Post-Op Procedure(s) (LRB): TOTAL KNEE ARTHROPLASTY (Right) Patient reports pain as mild.   Patient seen in rounds for Dr. Lequita Halt. Patient is well, and has had no acute complaints or problems. Reports that therapy is going well. No issues overnight. Curious about having CPM for home. Voiding well. Positive flatus.    Objective: Vital signs in last 24 hours: Temp:  [98.4 F (36.9 C)-98.6 F (37 C)] 98.4 F (36.9 C) (01/22 0532) Pulse Rate:  [88-101] 96 (01/22 0900) Resp:  [15-16] 16 (01/22 0532) BP: (143-152)/(74-85) 147/74 mmHg (01/22 0900) SpO2:  [94 %-99 %] 94 % (01/22 0532)  Intake/Output from previous day:  Intake/Output Summary (Last 24 hours) at 12/31/15 0944 Last data filed at 12/31/15 0118  Gross per 24 hour  Intake 3311.25 ml  Output   1900 ml  Net 1411.25 ml     Labs:  Recent Labs  12/30/15 0454 12/31/15 0537  HGB 11.8* 10.8*    Recent Labs  12/30/15 0454 12/31/15 0537  WBC 18.0* 19.3*  RBC 3.93 3.61*  HCT 36.2 33.5*  PLT 268 298    Recent Labs  12/30/15 0454 12/31/15 0537  NA 137 140  K 4.5 4.1  CL 105 103  CO2 23 28  BUN 12 13  CREATININE 0.80 0.89  GLUCOSE 192* 138*  CALCIUM 8.6* 8.7*    EXAM General - Patient is Alert and Oriented Extremity - Neurologically intact Intact pulses distally Dorsiflexion/Plantar flexion intact No cellulitis present Compartment soft Dressing/Incision - clean, dry, no drainage Motor Function - intact, moving foot and toes well on exam.   Past Medical History  Diagnosis Date  . Hypothyroidism   . Asthma     environmental elements-seasonal alleries/ asthma  . Hypertension   . Bilateral dry eyes     tx. OTC  med used  . Arthritis     osteoarthritis- knees, ankylosing spondylosis back    Assessment/Plan: 2 Days Post-Op Procedure(s) (LRB): TOTAL KNEE ARTHROPLASTY (Right) Principal Problem:   OA (osteoarthritis) of knee  Estimated body mass index is 42.57 kg/(m^2) as  calculated from the following:   Height as of this encounter: 5' 1.5" (1.562 m).   Weight as of this encounter: 103.874 kg (229 lb). Advance diet Up with therapy Discharge home with home health  DVT Prophylaxis - Xarelto Weight-Bearing as tolerated   She is doing well. No specific orders from Dr. Lequita Halt regarding CPM. Will touch base with him tomorrow in regards to whether or not he wants her to have at home. Move forward with DC home today.  Dimitri Ped, PA-C Orthopaedic Surgery 12/31/2015, 9:44 AM

## 2016-01-15 NOTE — Discharge Summary (Signed)
Physician Discharge Summary   Patient ID: Rachael Shelton MRN: [id number] DOB/AGE: 55/17/1962 55 y.o.  Admit date: 12/29/2015 Discharge date: 12/31/2015  Primary Diagnosis:  Osteoarthritis Right knee(s)  Admission Diagnoses:  Past Medical History  Diagnosis Date  . Hypothyroidism   . Asthma     environmental elements-seasonal alleries/ asthma  . Hypertension   . Bilateral dry eyes     tx. OTC  med used  . Arthritis     osteoarthritis- knees, ankylosing spondylosis back   Discharge Diagnoses:   Principal Problem:   OA (osteoarthritis) of knee  Estimated body mass index is 42.57 kg/(m^2) as calculated from the following:   Height as of this encounter: 5' 1.5" (1.562 m).   Weight as of this encounter: 103.874 kg (229 lb).  Procedure:  Procedure(s) (LRB): TOTAL KNEE ARTHROPLASTY (Right)   Consults: None  HPI: Rachael Shelton is a 55 y.o. year old female with end stage OA of her right knee with progressively worsening pain and dysfunction. She has constant pain, with activity and at rest and significant functional deficits with difficulties even with ADLs. She has had extensive non-op management including analgesics, injections of cortisone and viscosupplements, and home exercise program, but remains in significant pain with significant dysfunction.Radiographs show bone on bone arthritis medial and patellofemoral. She presents now for right Total Knee Arthroplasty.   Laboratory Data: Admission on 12/29/2015, Discharged on 12/31/2015  Component Date Value Ref Range Status  . ABO/RH(D) 12/29/2015 O POS   Final  . Antibody Screen 12/29/2015 NEG   Final  . Sample Expiration 12/29/2015 01/01/2016   Final  . ABO/RH(D) 12/29/2015 O POS   Final  . WBC 12/30/2015 18.0* 4.0 - 10.5 K/uL Final  . RBC 12/30/2015 3.93  3.87 - 5.11 MIL/uL Final  . Hemoglobin 12/30/2015 11.8* 12.0 - 15.0 g/dL Final  . HCT 12/30/2015 36.2  36.0 - 46.0 % Final  . MCV 12/30/2015 92.1  78.0 - 100.0 fL Final   . MCH 12/30/2015 30.0  26.0 - 34.0 pg Final  . MCHC 12/30/2015 32.6  30.0 - 36.0 g/dL Final  . RDW 12/30/2015 14.1  11.5 - 15.5 % Final  . Platelets 12/30/2015 268  150 - 400 K/uL Final  . Sodium 12/30/2015 137  135 - 145 mmol/L Final  . Potassium 12/30/2015 4.5  3.5 - 5.1 mmol/L Final  . Chloride 12/30/2015 105  101 - 111 mmol/L Final  . CO2 12/30/2015 23  22 - 32 mmol/L Final  . Glucose, Bld 12/30/2015 192* 65 - 99 mg/dL Final  . BUN 12/30/2015 12  6 - 20 mg/dL Final  . Creatinine, Ser 12/30/2015 0.80  0.44 - 1.00 mg/dL Final  . Calcium 12/30/2015 8.6* 8.9 - 10.3 mg/dL Final  . GFR calc non Af Amer 12/30/2015 >60  >60 mL/min Final  . GFR calc Af Amer 12/30/2015 >60  >60 mL/min Final   Comment: (NOTE) The eGFR has been calculated using the CKD EPI equation. This calculation has not been validated in all clinical situations. eGFR's persistently <60 mL/min signify possible Chronic Kidney Disease.   . Anion gap 12/30/2015 9  5 - 15 Final  . WBC 12/31/2015 19.3* 4.0 - 10.5 K/uL Final  . RBC 12/31/2015 3.61* 3.87 - 5.11 MIL/uL Final  . Hemoglobin 12/31/2015 10.8* 12.0 - 15.0 g/dL Final  . HCT 12/31/2015 33.5* 36.0 - 46.0 % Final  . MCV 12/31/2015 92.8  78.0 - 100.0 fL Final  . MCH 12/31/2015 29.9  26.0 -  34.0 pg Final  . MCHC 12/31/2015 32.2  30.0 - 36.0 g/dL Final  . RDW 12/31/2015 14.7  11.5 - 15.5 % Final  . Platelets 12/31/2015 298  150 - 400 K/uL Final  . Sodium 12/31/2015 140  135 - 145 mmol/L Final  . Potassium 12/31/2015 4.1  3.5 - 5.1 mmol/L Final  . Chloride 12/31/2015 103  101 - 111 mmol/L Final  . CO2 12/31/2015 28  22 - 32 mmol/L Final  . Glucose, Bld 12/31/2015 138* 65 - 99 mg/dL Final  . BUN 12/31/2015 13  6 - 20 mg/dL Final  . Creatinine, Ser 12/31/2015 0.89  0.44 - 1.00 mg/dL Final  . Calcium 12/31/2015 8.7* 8.9 - 10.3 mg/dL Final  . GFR calc non Af Amer 12/31/2015 >60  >60 mL/min Final  . GFR calc Af Amer 12/31/2015 >60  >60 mL/min Final   Comment: (NOTE) The  eGFR has been calculated using the CKD EPI equation. This calculation has not been validated in all clinical situations. eGFR's persistently <60 mL/min signify possible Chronic Kidney Disease.   Georgiann Hahn gap 12/31/2015 9  5 - 15 Final  Hospital Outpatient Visit on 12/15/2015  Component Date Value Ref Range Status  . MRSA, PCR 12/15/2015 NEGATIVE  NEGATIVE Final  . Staphylococcus aureus 12/15/2015 POSITIVE* NEGATIVE Final   Comment:        The Xpert SA Assay (FDA approved for NASAL specimens in patients over 58 years of age), is one component of a comprehensive surveillance program.  Test performance has been validated by [facility] for patients greater than or equal to 49 year old. It is not intended to diagnose infection nor to guide or monitor treatment.   . Preg, Serum 12/15/2015 NEGATIVE  NEGATIVE Final   Comment:        THE SENSITIVITY OF THIS METHODOLOGY IS >10 mIU/mL.   Marland Kitchen aPTT 12/15/2015 32  24 - 37 seconds Final  . WBC 12/15/2015 9.2  4.0 - 10.5 K/uL Final  . RBC 12/15/2015 4.07  3.87 - 5.11 MIL/uL Final  . Hemoglobin 12/15/2015 12.1  12.0 - 15.0 g/dL Final  . HCT 12/15/2015 37.5  36.0 - 46.0 % Final  . MCV 12/15/2015 92.1  78.0 - 100.0 fL Final  . MCH 12/15/2015 29.7  26.0 - 34.0 pg Final  . MCHC 12/15/2015 32.3  30.0 - 36.0 g/dL Final  . RDW 12/15/2015 13.9  11.5 - 15.5 % Final  . Platelets 12/15/2015 349  150 - 400 K/uL Final  . Sodium 12/15/2015 143  135 - 145 mmol/L Final  . Potassium 12/15/2015 3.8  3.5 - 5.1 mmol/L Final  . Chloride 12/15/2015 106  101 - 111 mmol/L Final  . CO2 12/15/2015 27  22 - 32 mmol/L Final  . Glucose, Bld 12/15/2015 105* 65 - 99 mg/dL Final  . BUN 12/15/2015 16  6 - 20 mg/dL Final  . Creatinine, Ser 12/15/2015 1.09* 0.44 - 1.00 mg/dL Final  . Calcium 12/15/2015 9.5  8.9 - 10.3 mg/dL Final  . Total Protein 12/15/2015 7.9  6.5 - 8.1 g/dL Final  . Albumin 12/15/2015 3.9  3.5 - 5.0 g/dL Final  . AST 12/15/2015 25  15 - 41 U/L Final    . ALT 12/15/2015 25  14 - 54 U/L Final  . Alkaline Phosphatase 12/15/2015 99  38 - 126 U/L Final  . Total Bilirubin 12/15/2015 0.4  0.3 - 1.2 mg/dL Final  . GFR calc non Af Amer 12/15/2015 56* >60 mL/min  Final  . GFR calc Af Amer 12/15/2015 >60  >60 mL/min Final   Comment: (NOTE) The eGFR has been calculated using the CKD EPI equation. This calculation has not been validated in all clinical situations. eGFR's persistently <60 mL/min signify possible Chronic Kidney Disease.   . Anion gap 12/15/2015 10  5 - 15 Final  . Prothrombin Time 12/15/2015 13.9  11.6 - 15.2 seconds Final  . INR 12/15/2015 1.05  0.00 - 1.49 Final  . Color, Urine 12/15/2015 YELLOW  YELLOW Final  . APPearance 12/15/2015 CLEAR  CLEAR Final  . Specific Gravity, Urine 12/15/2015 1.015  1.005 - 1.030 Final  . pH 12/15/2015 6.5  5.0 - 8.0 Final  . Glucose, UA 12/15/2015 NEGATIVE  NEGATIVE mg/dL Final  . Hgb urine dipstick 12/15/2015 NEGATIVE  NEGATIVE Final  . Bilirubin Urine 12/15/2015 NEGATIVE  NEGATIVE Final  . Ketones, ur 12/15/2015 NEGATIVE  NEGATIVE mg/dL Final  . Protein, ur 12/15/2015 NEGATIVE  NEGATIVE mg/dL Final  . Nitrite 12/15/2015 NEGATIVE  NEGATIVE Final  . Leukocytes, UA 12/15/2015 NEGATIVE  NEGATIVE Final   MICROSCOPIC NOT DONE ON URINES WITH NEGATIVE PROTEIN, BLOOD, LEUKOCYTES, NITRITE, OR GLUCOSE <1000 mg/dL.     X-Rays:No results found.  EKG: Orders placed or performed during the hospital encounter of 12/15/15  . EKG 12-Lead  . EKG 12-Lead     Hospital Course: Charlene Detter is a 55 y.o. who was admitted to [facility]. They were brought to the operating room on 12/29/2015 and underwent Procedure(s): TOTAL KNEE ARTHROPLASTY.  Patient tolerated the procedure well and was later transferred to the recovery room and then to the orthopaedic floor for postoperative care.  They were given PO and IV analgesics for pain control following their surgery.  They were given 24 hours of  postoperative antibiotics of  Anti-infectives    Start     Dose/Rate Route Frequency Ordered Stop   12/29/15 1600  ceFAZolin (ANCEF) IVPB 2 g/50 mL premix     2 g 100 mL/hr over 30 Minutes Intravenous Every 6 hours 12/29/15 1303 12/29/15 2320   12/29/15 0711  ceFAZolin (ANCEF) IVPB 2 g/50 mL premix     2 g 100 mL/hr over 30 Minutes Intravenous On call to O.R. 12/29/15 0175 12/29/15 1007     and started on DVT prophylaxis in the form of Xarelto.   PT and OT were ordered for total joint protocol.  Discharge planning consulted to help with postop disposition and equipment needs.  Patient had a tough night on the evening of surgery due to pain.  They started to get up OOB with therapy on day one. Hemovac drain was pulled without difficulty.  Continued to work with therapy into day two.  Dressing was changed on day two and the incision was healing well. Patient was seen in rounds by the weekend coverage staff and was ready to go home.   Diet: Regular diet Activity:WBAT Follow-up:in 2 weeks Disposition - Home Discharged Condition: good   Discharge Instructions    Call MD / Call 911    Complete by:  As directed   If you experience chest pain or shortness of breath, CALL 911 and be transported to the hospital emergency room.  If you develope a fever above 101 F, pus (white drainage) or increased drainage or redness at the wound, or calf pain, call your surgeon's office.     Constipation Prevention    Complete by:  As directed   Drink plenty of fluids.  Prune juice may be helpful.  You may use a stool softener, such as Colace (over the counter) 100 mg twice a day.  Use MiraLax (over the counter) for constipation as needed.     Diet - low sodium heart healthy    Complete by:  As directed      Discharge instructions    Complete by:  As directed   TOTAL KNEE REPLACEMENT POSTOPERATIVE DIRECTIONS  Knee Rehabilitation, Guidelines Following Surgery  Results after knee surgery are often greatly  improved when you follow the exercise, range of motion and muscle strengthening exercises prescribed by your doctor. Safety measures are also important to protect the knee from further injury. Any time any of these exercises cause you to have increased pain or swelling in your knee joint, decrease the amount until you are comfortable again and slowly increase them. If you have problems or questions, call your caregiver or physical therapist for advice.   HOME CARE INSTRUCTIONS  Remove items at home which could result in a fall. This includes throw rugs or furniture in walking pathways.  ICE to the affected knee every three hours for 30 minutes at a time and then as needed for pain and swelling.  Continue to use ice on the knee for pain and swelling from surgery. You may notice swelling that will progress down to the foot and ankle.  This is normal after surgery.  Elevate the leg when you are not up walking on it.   Continue to use the breathing machine which will help keep your temperature down.  It is common for your temperature to cycle up and down following surgery, especially at night when you are not up moving around and exerting yourself.  The breathing machine keeps your lungs expanded and your temperature down. Do not place pillow under knee, focus on keeping the knee straight while resting  DIET You may resume your previous home diet once your are discharged from the hospital.  DRESSING / WOUND CARE / SHOWERING You may start showering once you are discharged home but do not submerge the incision under water. Just pat the incision dry and apply a dry gauze dressing on daily. Change the surgical dressing daily and reapply a dry dressing each time.  ACTIVITY Walk with your walker as instructed. Use walker as long as suggested by your caregivers. Avoid periods of inactivity such as sitting longer than an hour when not asleep. This helps prevent blood clots.  You may resume a sexual  relationship in one month or when given the OK by your doctor.  You may return to work once you are cleared by your doctor.  Do not drive a car for 6 weeks or until released by you surgeon.  Do not drive while taking narcotics.  WEIGHT BEARING Weight bearing as tolerated with assist device (walker, cane, etc) as directed, use it as long as suggested by your surgeon or therapist, typically at least 4-6 weeks.  POSTOPERATIVE CONSTIPATION PROTOCOL Constipation - defined medically as fewer than three stools per week and severe constipation as less than one stool per week.  One of the most common issues patients have following surgery is constipation.  Even if you have a regular bowel pattern at home, your normal regimen is likely to be disrupted due to multiple reasons following surgery.  Combination of anesthesia, postoperative narcotics, change in appetite and fluid intake all can affect your bowels.  In order to avoid complications following surgery, here are some recommendations  in order to help you during your recovery period.  Colace (docusate) - Pick up an over-the-counter form of Colace or another stool softener and take twice a day as long as you are requiring postoperative pain medications.  Take with a full glass of water daily.  If you experience loose stools or diarrhea, hold the colace until you stool forms back up.  If your symptoms do not get better within 1 week or if they get worse, check with your doctor.  Dulcolax (bisacodyl) - Pick up over-the-counter and take as directed by the product packaging as needed to assist with the movement of your bowels.  Take with a full glass of water.  Use this product as needed if not relieved by Colace only.   MiraLax (polyethylene glycol) - Pick up over-the-counter to have on hand.  MiraLax is a solution that will increase the amount of water in your bowels to assist with bowel movements.  Take as directed and can mix with a glass of water, juice,  soda, coffee, or tea.  Take if you go more than two days without a movement. Do not use MiraLax more than once per day. Call your doctor if you are still constipated or irregular after using this medication for 7 days in a row.  If you continue to have problems with postoperative constipation, please contact the office for further assistance and recommendations.  If you experience "the worst abdominal pain ever" or develop nausea or vomiting, please contact the office immediatly for further recommendations for treatment.  ITCHING  If you experience itching with your medications, try taking only a single pain pill, or even half a pain pill at a time.  You can also use Benadryl over the counter for itching or also to help with sleep.   TED HOSE STOCKINGS Wear the elastic stockings on both legs for three weeks following surgery during the day but you may remove then at night for sleeping.  MEDICATIONS See your medication summary on the "After Visit Summary" that the nursing staff will review with you prior to discharge.  You may have some home medications which will be placed on hold until you complete the course of blood thinner medication.  It is important for you to complete the blood thinner medication as prescribed by your surgeon.  Continue your approved medications as instructed at time of discharge.  PRECAUTIONS If you experience chest pain or shortness of breath - call 911 immediately for transfer to the hospital emergency department.  If you develop a fever greater that 101 F, purulent drainage from wound, increased redness or drainage from wound, foul odor from the wound/dressing, or calf pain - CONTACT YOUR SURGEON.                                                   FOLLOW-UP APPOINTMENTS Make sure you keep all of your appointments after your operation with your surgeon and caregivers. You should call the office at the above phone number and make an appointment for approximately two weeks  after the date of your surgery or on the date instructed by your surgeon outlined in the "After Visit Summary".   RANGE OF MOTION AND STRENGTHENING EXERCISES  Rehabilitation of the knee is important following a knee injury or an operation. After just a few days of immobilization, the muscles  of the thigh which control the knee become weakened and shrink (atrophy). Knee exercises are designed to build up the tone and strength of the thigh muscles and to improve knee motion. Often times heat used for twenty to thirty minutes before working out will loosen up your tissues and help with improving the range of motion but do not use heat for the first two weeks following surgery. These exercises can be done on a training (exercise) mat, on the floor, on a table or on a bed. Use what ever works the best and is most comfortable for you Knee exercises include:  Leg Lifts - While your knee is still immobilized in a splint or cast, you can do straight leg raises. Lift the leg to 60 degrees, hold for 3 sec, and slowly lower the leg. Repeat 10-20 times 2-3 times daily. Perform this exercise against resistance later as your knee gets better.  Quad and Hamstring Sets - Tighten up the muscle on the front of the thigh (Quad) and hold for 5-10 sec. Repeat this 10-20 times hourly. Hamstring sets are done by pushing the foot backward against an object and holding for 5-10 sec. Repeat as with quad sets.  Leg Slides: Lying on your back, slowly slide your foot toward your buttocks, bending your knee up off the floor (only go as far as is comfortable). Then slowly slide your foot back down until your leg is flat on the floor again. Angel Wings: Lying on your back spread your legs to the side as far apart as you can without causing discomfort.  A rehabilitation program following serious knee injuries can speed recovery and prevent re-injury in the future due to weakened muscles. Contact your doctor or a physical therapist for more  information on knee rehabilitation.   IF YOU ARE TRANSFERRED TO A SKILLED REHAB FACILITY If the patient is transferred to a skilled rehab facility following release from the hospital, a list of the current medications will be sent to the facility for the patient to continue.  When discharged from the skilled rehab facility, please have the facility set up the patient's [place] prior to being released. Also, the skilled facility will be responsible for providing the patient with their medications at time of release from the facility to include their pain medication, the muscle relaxants, and their blood thinner medication. If the patient is still at the rehab facility at time of the two week follow up appointment, the skilled rehab facility will also need to assist the patient in arranging follow up appointment in our office and any transportation needs.  MAKE SURE YOU:  Understand these instructions.  Get help right away if you are not doing well or get worse.    Pick up stool softner and laxative for home use following surgery while on pain medications. Do not submerge incision under water. Please use good hand washing techniques while changing dressing each day. May shower starting three days after surgery. Please use a clean towel to pat the incision dry following showers. Continue to use ice for pain and swelling after surgery. Do not use any lotions or creams on the incision until instructed by your surgeon.     Increase activity slowly as tolerated    Complete by:  As directed             Medication List    STOP taking these medications        diclofenac 75 MG EC tablet  Commonly known as:  VOLTAREN     diclofenac sodium 1 % Gel  Commonly known as:  VOLTAREN     FISH OIL PO     multivitamin with minerals tablet     polyvinyl alcohol 1.4 % ophthalmic solution  Commonly known as:  LIQUIFILM TEARS     vitamin E 400 UNIT capsule      TAKE these  medications        acetaminophen 500 MG tablet  Commonly known as:  TYLENOL  Take 1,000 mg by mouth every 6 (six) hours as needed.     albuterol 108 (90 Base) MCG/ACT inhaler  Commonly known as:  PROVENTIL HFA;VENTOLIN HFA  Inhale 2 puffs into the lungs every 6 (six) hours as needed for wheezing or shortness of breath (environmental asthma).     albuterol (2.5 MG/3ML) 0.083% nebulizer solution  Commonly known as:  PROVENTIL  Take 2.5 mg by nebulization every 6 (six) hours as needed for wheezing or shortness of breath.     amLODipine 5 MG tablet  Commonly known as:  NORVASC  Take 5 mg by mouth daily.     cetirizine 10 MG tablet  Commonly known as:  ZYRTEC  Take 10 mg by mouth daily as needed for allergies.     cholecalciferol 1000 units tablet  Commonly known as:  VITAMIN D  Take 1,000 Units by mouth daily.     DULoxetine 30 MG capsule  Commonly known as:  CYMBALTA  Take 30 mg by mouth at bedtime.     Fluticasone-Salmeterol 250-50 MCG/DOSE Aepb  Commonly known as:  ADVAIR  Inhale 2 puffs into the lungs 2 (two) times daily.     levothyroxine 100 MCG tablet  Commonly known as:  SYNTHROID, LEVOTHROID  Take 100 mcg by mouth daily before breakfast.     losartan 100 MG tablet  Commonly known as:  COZAAR  Take 100 mg by mouth daily.     Magnesium 250 MG Tabs  Take 1 tablet by mouth at bedtime.     methocarbamol 500 MG tablet  Commonly known as:  ROBAXIN  Take 1 tablet (500 mg total) by mouth every 6 (six) hours as needed for muscle spasms.     omeprazole 20 MG capsule  Commonly known as:  PRILOSEC  Take 20 mg by mouth daily.     oxyCODONE 5 MG immediate release tablet  Commonly known as:  Oxy IR/ROXICODONE  Take 1-2 tablets (5-10 mg total) by mouth every 3 (three) hours as needed for breakthrough pain.     rivaroxaban 10 MG Tabs tablet  Commonly known as:  XARELTO  Take 1 tablet (10 mg total) by mouth daily with breakfast.           Follow-up Information     Follow up with Gearlean Alf, MD. Schedule an appointment as soon as possible for a visit on 01/11/2016.   Specialty:  Orthopedic Surgery   Why:  Call [phone] Monday to make the appointment   Contact information:   [address] [place] [address] [phone]       Follow up with [facility].   Why:  home health physical therapy   Contact information:   [address] [place] [address] [phone]       Signed: Arlee Muslim, PA-C Orthopaedic Surgery 01/15/2016, 4:29 PM

## 2016-02-26 ENCOUNTER — Ambulatory Visit: Payer: Self-pay | Admitting: Orthopedic Surgery

## 2016-02-26 NOTE — H&P (Signed)
Rachael Shelton DOB: 1961/04/28 Married / Language: English / Race: White Female Date of Admission:  03/11/2016 CC:  Left Knee Pain History of Present Illness The patient is a 55 year old female who  in today for a preoperative History and Physical. The patient is scheduled for a left total knee arthroplasty to be performed by Dr. Gus Rankin. Aluisio, MD at [facility] on 03/11/2016. The patient is a 55 year old female who comes in a few months out from right total knee arthroplasty. The patient states that she is doing well (swelling/warm) at this time. The pain is under fair control at this time and describe their pain as mild. They are currently on Ultram for their pain. The patient feels that they are progressing well at this time. The patient is also being followed for their left knee pain and osteoarthritis. They are now year(s) out from when symptoms began. Symptoms reported include: pain, swelling, popping and grinding. The patient feels that they are doing poorly and report their pain level to be moderate to severe. Current treatment includes: NSAIDs. The following medication has been used for pain control: antiinflammatory medication and Ultram. The patient has reported improvement of their symptoms with: Cortisone injections (relief for a couple of months). She is real pleased with how her right knee is doing. She is making excellent progress. Pain has definitely greatly improved. Left knee is what is giving her the most problem. She has documented advanced arthritis of the left knee also. She has had cortisone which has helped for only short amounts of time. She is ready to proceed with the left knee replacement as she feels like she has recovered extremely well on the right. They have been treated conservatively in the past for the above stated problem and despite conservative measures, they continue to have progressive pain and severe functional limitations and dysfunction. They have failed  non-operative management including home exercise, medications, and injections. It is felt that they would benefit from undergoing total joint replacement. Risks and benefits of the procedure have been discussed with the patient and they elect to proceed with surgery. There are no active contraindications to surgery such as ongoing infection or rapidly progressive neurological disease.   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 OxyCODONE HCl *ANALGESICS - OPIOID*  Slight Itching  Family History Chronic Obstructive Lung Disease  Mother. Osteoarthritis  Father, Maternal Grandmother, Mother. Heart Disease  Paternal Grandfather. Cancer  Father, Maternal Grandmother. Cerebrovascular Accident  Maternal Grandfather. Drug / Alcohol Addiction  Father.  Social History  Current drinker  05/30/2015: Currently drinks beer, wine and hard liquor only occasionally per week Current work status  working full time Not under pain contract  Number of flights of stairs before winded  2-3 Tobacco use  Never smoker. 05/30/2015 Exercise  Exercises weekly; does running / walking and other Living situation  live with spouse No history of drug/alcohol rehab  Children  1 Marital status  married Post-Surgical Plans  Home with Husband  Medication History Centrum (Oral) Active. Voltaren (  Tablet DR, Oral) Active. Tylenol (  Capsule,  Oral) Active. Vitamin D3 Specific strength unknown - Active. DULoxetine HCl (Oral)  Specific strength unknown - Active. (per PCP Cymbalta generic) Omega 3 (Oral) Specific strength unknown - Active. Magnesium Gluconate (Oral) Specific strength unknown - Active. Advair Diskus (Inhalation) Specific strength unknown - Active. Albuterol Sulfate (Inhalation) Specific strength unknown - Active. (prn) Levothyroxine Sodium (Oral) Specific strength unknown - Active. Losartan Potassium (Oral) Specific strength unknown - Active. Omeprazole (Oral) Specific strength unknown - Active. ZyrTEC (Oral As Needed) Specific strength unknown - Active. (prn)  Past Surgical History Sinus Surgery  Straighten Nasal Septum  Carpal Tunnel Repair  bilateral Arthroscopy of Knee  right Dilation and Curettage of Uterus - Multiple  Cesarean Delivery  1 time Total Knee Replacement - Right   Review of Systems  General Not Present- Chills, Fatigue, Fever, Memory Loss, Night Sweats, Weight Gain and Weight Loss. Skin Not Present- Eczema, Hives, Itching, Lesions and Rash. HEENT Not Present- Dentures, Double Vision, Headache, Hearing Loss, Tinnitus and Visual Loss. Respiratory Not Present- Allergies, Chronic Cough, Coughing up blood, Shortness of breath at rest and Shortness of breath with exertion. Cardiovascular Not Present- Chest Pain, Difficulty Breathing Lying Down, Murmur, Palpitations, Racing/skipping heartbeats and Swelling. Gastrointestinal Not Present- Abdominal Pain, Bloody Stool, Constipation, Diarrhea, Difficulty Swallowing, Heartburn, Jaundice, Loss of appetitie, Nausea and Vomiting. Female Genitourinary Not Present- Blood in Urine, Discharge, Flank Pain, Incontinence, Painful Urination, Urgency, Urinary frequency, Urinary Retention, Urinating at Night and Weak urinary stream. Musculoskeletal Present- Joint Pain. Not Present- Back Pain, Joint Swelling, Morning Stiffness, Muscle Pain, Muscle Weakness and Spasms. Neurological Not Present- Blackout spells, Difficulty with balance,  Dizziness, Paralysis, Tremor and Weakness. Psychiatric Not Present- Insomnia.  Vitals Weight: 210 lb Height: 62in Weight was reported by patient. Height was reported by patient. Body Surface Area: 1.95 m Body Mass Index: 38.41 kg/m  Pulse: 88 (Regular)  BP: 128/78 (Sitting, Left Arm, Standard)  Physical Exam General Mental Status -Alert, cooperative and good historian. General Appearance-pleasant, Not in acute distress. Orientation-Oriented X3. Build & Nutrition-Well nourished and Well developed.  Head and Neck Head-normocephalic, atraumatic . Neck Global Assessment - supple, no bruit auscultated on the right, no bruit auscultated on the left.  Eye Pupil - Bilateral-Regular and Round. Motion - Bilateral-EOMI.  Chest and Lung Exam Auscultation Breath sounds - clear at anterior chest wall and clear at posterior chest wall. Adventitious sounds - Inspiratory wheeze - Right Lower Lobe (Posterior)(slight end inspiratory) and Left Lower Lobe (Posterior)(slight end inspiratory).  Cardiovascular Auscultation Rhythm - Regular rate and rhythm. Heart Sounds - S1 WNL and S2 WNL. Murmurs & Other Heart Sounds - Auscultation of the heart reveals - No Murmurs.  Abdomen Palpation/Percussion Tenderness - Abdomen is non-tender to palpation. Rigidity (guarding) - Abdomen is soft. Auscultation Auscultation of the abdomen reveals - Bowel sounds normal.  Female Genitourinary Note: Not done, not pertinent to present illness   Musculoskeletal Note: On exam, she is alert and oriented, in no apparent distress. Her left knee shows no effusion. Range about 5 to 125. Marked crepitus on range of motion. Tender in medial greater than lateral. No instability. Right knee; minimal swelling. Range of motion of the right knee is 0 to 112 degrees, and no tenderness or instability. She is walking with minimal limp.  RADIOGRAPHS AP and lateral of the right knee show her  prosthesis in excellent position with no periprosthetic abnormalities. On the left, she has bone-on-bone arthritis in the medial and patellofemoral compartments with significant tibial subluxation.   Assessment & Plan  Status post total right knee replacement (Z61.096(Z96.651) Primary osteoarthritis of left knee (M17.12)  Note:Surgical Plans: Left Total Knee Replacement  Disposition: Home  PCP: Dr. Maryelizabeth RowanElizabeth Dewey  IV TXA  Anesthesia Issues: None  Signed electronically by Beckey RutterAlezandrew L Rashawn Rolon, III PA-C

## 2016-02-28 ENCOUNTER — Ambulatory Visit: Payer: Self-pay | Admitting: Orthopedic Surgery

## 2016-02-28 NOTE — Progress Notes (Signed)
Preoperative surgical orders have been place into the Epic hospital system for Rachael Shelton on 02/28/2016, 10:40 AM  by Patrica DuelPERKINS, Manisha Cancel for surgery on 03-11-2016.  Preop Total Knee orders including Experal, IV Tylenol, and IV Decadron as long as there are no contraindications to the above medications. Rachael Peacerew Chez Bulnes, PA-C

## 2016-03-01 NOTE — Patient Instructions (Addendum)
Rachael ChangJudy A Vannice  03/01/2016   Your procedure is scheduled on: Monday 03/11/2016  [facility]  Entrance take [place]  elevators to 3rd floor to  Short Stay Center at  0515  AM.  Call this number if you have problems the morning of surgery [phone]   Remember: ONLY 1 PERSON MAY GO WITH YOU TO SHORT STAY TO GET  READY MORNING OF YOUR SURGERY.   Do not eat food or drink liquids :After Midnight.     Take these medicines the morning of surgery with A SIP OF WATER: Amlodipine. Levothyroxine. Omeprazole. Use /Bring Inhalers                                 You may not have any metal on your body including hair pins and              piercings  Do not wear jewelry, make-up, lotions, powders or perfumes, deodorant             Do not wear nail polish.  Do not shave  48 hours prior to surgery.              Men may shave face and neck.   Do not bring valuables to the hospital. [name] IS NOT             RESPONSIBLE   FOR VALUABLES.  Contacts, dentures or bridgework may not be worn into surgery.  Leave suitcase in the car. After surgery it may be brought to your room.                  Please read over the following fact sheets you were given: _____________________________________________________________________             [facility] - Preparing for Surgery Before surgery, you can play an important role.  Because skin is not sterile, your skin needs to be as free of germs as possible.  You can reduce the number of germs on your skin by washing with CHG (chlorahexidine gluconate) soap before surgery.  CHG is an antiseptic cleaner which kills germs and bonds with the skin to continue killing germs even after washing. Please DO NOT use if you have an allergy to CHG or antibacterial soaps.  If your skin becomes reddened/irritated stop using the CHG and inform your nurse when you arrive at Short Stay. Do not shave (including legs and underarms) for at least  48 hours prior to the first CHG shower.  You may shave your face/neck. Please follow these instructions carefully:  1.  Shower with CHG Soap the night before surgery and the  morning of Surgery.  2.  If you choose to wash your hair, wash your hair first as usual with your  normal  shampoo.  3.  After you shampoo, rinse your hair and body thoroughly to remove the  shampoo.                           4.  Use CHG as you would any other liquid soap.  You can apply chg directly  to the skin and wash                       Gently with a scrungie  or clean washcloth.  5.  Apply the CHG Soap to your body ONLY FROM THE NECK DOWN.   Do not use on face/ open                           Wound or open sores. Avoid contact with eyes, ears mouth and genitals (private parts).                       Wash face,  Genitals (private parts) with your normal soap.             6.  Wash thoroughly, paying special attention to the area where your surgery  will be performed.  7.  Thoroughly rinse your body with warm water from the neck down.  8.  DO NOT shower/wash with your normal soap after using and rinsing off  the CHG Soap.                9.  Pat yourself dry with a clean towel.            10.  Wear clean pajamas.            11.  Place clean sheets on your bed the night of your first shower and do not  sleep with pets. Day of Surgery : Do not apply any lotions/deodorants the morning of surgery.  Please wear clean clothes to the hospital/surgery center.  FAILURE TO FOLLOW THESE INSTRUCTIONS MAY RESULT IN THE CANCELLATION OF YOUR SURGERY PATIENT SIGNATURE_________________________________  NURSE SIGNATURE__________________________________  ________________________________________________________________________   Rachael Shelton  An incentive spirometer is a tool that can help keep your lungs clear and active. This tool measures how well you are filling your lungs with each breath. Taking long deep breaths may  help reverse or decrease the chance of developing breathing (pulmonary) problems (especially infection) following:  A long period of time when you are unable to move or be active. BEFORE THE PROCEDURE   If the spirometer includes an indicator to show your best effort, your nurse or respiratory therapist will set it to a desired goal.  If possible, sit up straight or lean slightly forward. Try not to slouch.  Hold the incentive spirometer in an upright position. INSTRUCTIONS FOR USE   Sit on the edge of your bed if possible, or sit up as far as you can in bed or on a chair.  Hold the incentive spirometer in an upright position.  Breathe out normally.  Place the mouthpiece in your mouth and seal your lips tightly around it.  Breathe in slowly and as deeply as possible, raising the piston or the ball toward the top of the column.  Hold your breath for 3-5 seconds or for as long as possible. Allow the piston or ball to fall to the bottom of the column.  Remove the mouthpiece from your mouth and breathe out normally.  Rest for a few seconds and repeat Steps 1 through 7 at least 10 times every 1-2 hours when you are awake. Take your time and take a few normal breaths between deep breaths.  The spirometer may include an indicator to show your best effort. Use the indicator as a goal to work toward during each repetition.  After each set of 10 deep breaths, practice coughing to be sure your lungs are clear. If you have an incision (the cut made at the time of surgery), support your incision when  coughing by placing a pillow or rolled up towels firmly against it. Once you are able to get out of bed, walk around indoors and cough well. You may stop using the incentive spirometer when instructed by your caregiver.  RISKS AND COMPLICATIONS  Take your time so you do not get dizzy or light-headed.  If you are in pain, you may need to take or ask for pain medication before doing incentive  spirometry. It is harder to take a deep breath if you are having pain. AFTER USE  Rest and breathe slowly and easily.  It can be helpful to keep track of a log of your progress. Your caregiver can provide you with a simple table to help with this. If you are using the spirometer at home, follow these instructions: [employer] IF:   You are having difficultly using the spirometer.  You have trouble using the spirometer as often as instructed.  Your pain medication is not giving enough relief while using the spirometer.  You develop fever of 100.5 F (38.1 C) or higher. SEEK IMMEDIATE MEDICAL CARE IF:   You cough up bloody sputum that had not been present before.  You develop fever of 102 F (38.9 C) or greater.  You develop worsening pain at or near the incision site. MAKE SURE YOU:   Understand these instructions.  Will watch your condition.  Will get help right away if you are not doing well or get worse. Document Released: 04/07/2007 Document Revised: 02/17/2012 Document Reviewed: 06/08/2007 ExitCare Patient Information 2014 ExitCare, [place].   ________________________________________________________________________  WHAT IS A BLOOD TRANSFUSION? Blood Transfusion Information  A transfusion is the replacement of blood or some of its parts. Blood is made up of multiple cells which provide different functions.  Red blood cells carry oxygen and are used for blood loss replacement.  White blood cells fight against infection.  Platelets control bleeding.  Plasma helps clot blood.  Other blood products are available for specialized needs, such as hemophilia or other clotting disorders. BEFORE THE TRANSFUSION  Who gives blood for transfusions?   Healthy volunteers who are fully evaluated to make sure their blood is safe. This is blood bank blood. Transfusion therapy is the safest it has ever been in the practice of medicine. Before blood is taken from a donor, a  complete history is taken to make sure that person has no history of diseases nor engages in risky social behavior (examples are intravenous drug use or sexual activity with multiple partners). The donor's travel history is screened to minimize risk of transmitting infections, such as malaria. The donated blood is tested for signs of infectious diseases, such as HIV and hepatitis. The blood is then tested to be sure it is compatible with you in order to minimize the chance of a transfusion reaction. If you or a relative donates blood, this is often done in anticipation of surgery and is not appropriate for emergency situations. It takes many days to process the donated blood. RISKS AND COMPLICATIONS Although transfusion therapy is very safe and saves many lives, the main dangers of transfusion include:   Getting an infectious disease.  Developing a transfusion reaction. This is an allergic reaction to something in the blood you were given. Every precaution is taken to prevent this. The decision to have a blood transfusion has been considered carefully by your caregiver before blood is given. Blood is not given unless the benefits outweigh the risks. AFTER THE TRANSFUSION  Right after receiving  a blood transfusion, you will usually feel much better and more energetic. This is especially true if your red blood cells have gotten low (anemic). The transfusion raises the level of the red blood cells which carry oxygen, and this usually causes an energy increase.  The nurse administering the transfusion will monitor you carefully for complications. HOME CARE INSTRUCTIONS  No special instructions are needed after a transfusion. You may find your energy is better. Speak with your caregiver about any limitations on activity for underlying diseases you may have. SEEK MEDICAL CARE IF:   Your condition is not improving after your transfusion.  You develop redness or irritation at the intravenous (IV)  site. SEEK IMMEDIATE MEDICAL CARE IF:  Any of the following symptoms occur over the next 12 hours:  Shaking chills.  You have a temperature by mouth above 102 F (38.9 C), not controlled by medicine.  Chest, back, or muscle pain.  People around you feel you are not acting correctly or are confused.  Shortness of breath or difficulty breathing.  Dizziness and fainting.  You get a rash or develop hives.  You have a decrease in urine output.  Your urine turns a dark color or changes to pink, red, or brown. Any of the following symptoms occur over the next 10 days:  You have a temperature by mouth above 102 F (38.9 C), not controlled by medicine.  Shortness of breath.  Weakness after normal activity.  The white part of the eye turns yellow (jaundice).  You have a decrease in the amount of urine or are urinating less often.  Your urine turns a dark color or changes to pink, red, or brown. Document Released: 11/22/2000 Document Revised: 02/17/2012 Document Reviewed: 07/11/2008 [facility] Patient Information 2014 [place], [place].  _______________________________________________________________________

## 2016-03-04 ENCOUNTER — Encounter (HOSPITAL_COMMUNITY): Payer: Self-pay

## 2016-03-04 ENCOUNTER — Encounter (HOSPITAL_COMMUNITY)
Admission: RE | Admit: 2016-03-04 | Discharge: 2016-03-04 | Disposition: A | Discharge status: Home / Self Care | Payer: 59 | Source: Ambulatory Visit | Attending: Orthopedic Surgery | Admitting: Orthopedic Surgery

## 2016-03-04 DIAGNOSIS — Z01812 Encounter for preprocedural laboratory examination: Secondary | ICD-10-CM | POA: Insufficient documentation

## 2016-03-04 DIAGNOSIS — M1712 Unilateral primary osteoarthritis, left knee: Secondary | ICD-10-CM | POA: Diagnosis not present

## 2016-03-04 LAB — COMPREHENSIVE METABOLIC PANEL
ALBUMIN: 3.8 g/dL (ref 3.5–5.0)
ALK PHOS: 96 U/L (ref 38–126)
ALT: 21 U/L (ref 14–54)
AST: 23 U/L (ref 15–41)
Anion gap: 9 (ref 5–15)
BUN: 18 mg/dL (ref 6–20)
CALCIUM: 9 mg/dL (ref 8.9–10.3)
CO2: 25 mmol/L (ref 22–32)
Chloride: 108 mmol/L (ref 101–111)
Creatinine, Ser: 0.97 mg/dL (ref 0.44–1.00)
GFR calc Af Amer: >60 mL/min (ref >60)
GFR calc non Af Amer: >60 mL/min (ref >60)
GLUCOSE: 162 mg/dL — AB (ref 65–99)
POTASSIUM: 4 mmol/L (ref 3.5–5.1)
SODIUM: 142 mmol/L (ref 135–145)
Total Bilirubin: 0.4 mg/dL (ref 0.3–1.2)
Total Protein: 7.2 g/dL (ref 6.5–8.1)

## 2016-03-04 LAB — APTT: APTT: 34 s (ref 24–37)

## 2016-03-04 LAB — SURGICAL PCR SCREEN
MRSA, PCR: NEGATIVE
STAPHYLOCOCCUS AUREUS: POSITIVE — AB

## 2016-03-04 LAB — CBC
HCT: 36.9 % (ref 36.0–46.0)
HEMOGLOBIN: 12 g/dL (ref 12.0–15.0)
MCH: 28.1 pg (ref 26.0–34.0)
MCHC: 32.5 g/dL (ref 30.0–36.0)
MCV: 86.4 fL (ref 78.0–100.0)
Platelets: 323 10*3/uL (ref 150–400)
RBC: 4.27 MIL/uL (ref 3.87–5.11)
RDW: 14.7 % (ref 11.5–15.5)
WBC: 7.9 10*3/uL (ref 4.0–10.5)

## 2016-03-04 LAB — URINE MICROSCOPIC-ADD ON
Bacteria, UA: NONE SEEN
RBC / HPF: NONE SEEN RBC/hpf (ref 0–5)
WBC, UA: NONE SEEN WBC/hpf (ref 0–5)

## 2016-03-04 LAB — URINALYSIS, ROUTINE W REFLEX MICROSCOPIC
BILIRUBIN URINE: NEGATIVE
Glucose, UA: NEGATIVE mg/dL
HGB URINE DIPSTICK: NEGATIVE
KETONES UR: NEGATIVE mg/dL
Leukocytes, UA: NEGATIVE
Nitrite: NEGATIVE
PROTEIN: NEGATIVE mg/dL
Specific Gravity, Urine: 1.025 (ref 1.005–1.030)
pH: 5.5 (ref 5.0–8.0)

## 2016-03-04 LAB — HCG, SERUM, QUALITATIVE: PREG SERUM: NEGATIVE

## 2016-03-04 LAB — PROTIME-INR
INR: 1.18 (ref 0.00–1.49)
Prothrombin Time: 14.7 seconds (ref 11.6–15.2)

## 2016-03-04 NOTE — Pre-Procedure Instructions (Addendum)
03-04-16 1530 Voice mail left to inform pt of Positive Staph aureus-pt to use Mupirocin ointment-has already, ask to return call to verify message received. Note to Dr. Deri FuellingAluisio's office pod [phone] to inform of this , also need to note urinalysis -labs viewable in Epic. 03-04-1714 Pt. Called to say she received message and will use Mupirocin as directed.

## 2016-03-04 NOTE — Pre-Procedure Instructions (Signed)
EKG 1'17 Epic.

## 2016-03-08 NOTE — Pre-Procedure Instructions (Addendum)
03-08-16 1000 Left voice message to inform of surgery time change to 0915 AM, asking pt to arrive 0600 AM to Short Stay-all other instructions unchanged. Pt asked to return call to confirm message received.

## 2016-03-11 ENCOUNTER — Encounter (HOSPITAL_COMMUNITY): Payer: Self-pay | Admitting: *Deleted

## 2016-03-11 ENCOUNTER — Encounter (HOSPITAL_COMMUNITY)
Admission: RE | Disposition: A | Discharge status: Home Health | Payer: Self-pay | Source: Ambulatory Visit | Attending: Orthopedic Surgery

## 2016-03-11 ENCOUNTER — Inpatient Hospital Stay (HOSPITAL_COMMUNITY): Payer: 59 | Admitting: Anesthesiology

## 2016-03-11 ENCOUNTER — Inpatient Hospital Stay (HOSPITAL_COMMUNITY)
Admission: RE | Admit: 2016-03-11 | Discharge: 2016-03-13 | DRG: 470 | Disposition: A | Discharge status: Home Health | Payer: 59 | Source: Ambulatory Visit | Attending: Orthopedic Surgery | Admitting: Orthopedic Surgery

## 2016-03-11 DIAGNOSIS — M810 Age-related osteoporosis without current pathological fracture: Secondary | ICD-10-CM | POA: Diagnosis present

## 2016-03-11 DIAGNOSIS — G8929 Other chronic pain: Secondary | ICD-10-CM | POA: Diagnosis present

## 2016-03-11 DIAGNOSIS — Z79899 Other long term (current) drug therapy: Secondary | ICD-10-CM | POA: Diagnosis not present

## 2016-03-11 DIAGNOSIS — M1710 Unilateral primary osteoarthritis, unspecified knee: Secondary | ICD-10-CM | POA: Diagnosis present

## 2016-03-11 DIAGNOSIS — I10 Essential (primary) hypertension: Secondary | ICD-10-CM | POA: Diagnosis present

## 2016-03-11 DIAGNOSIS — J45909 Unspecified asthma, uncomplicated: Secondary | ICD-10-CM | POA: Diagnosis present

## 2016-03-11 DIAGNOSIS — Z96651 Presence of right artificial knee joint: Secondary | ICD-10-CM | POA: Diagnosis present

## 2016-03-11 DIAGNOSIS — E039 Hypothyroidism, unspecified: Secondary | ICD-10-CM | POA: Diagnosis present

## 2016-03-11 DIAGNOSIS — Z01812 Encounter for preprocedural laboratory examination: Secondary | ICD-10-CM

## 2016-03-11 DIAGNOSIS — M179 Osteoarthritis of knee, unspecified: Secondary | ICD-10-CM | POA: Diagnosis present

## 2016-03-11 DIAGNOSIS — M1712 Unilateral primary osteoarthritis, left knee: Secondary | ICD-10-CM | POA: Diagnosis present

## 2016-03-11 DIAGNOSIS — M25562 Pain in left knee: Secondary | ICD-10-CM | POA: Diagnosis present

## 2016-03-11 HISTORY — PX: TOTAL KNEE ARTHROPLASTY: SHX125

## 2016-03-11 LAB — TYPE AND SCREEN
ABO/RH(D): O POS
ANTIBODY SCREEN: NEGATIVE

## 2016-03-11 SURGERY — ARTHROPLASTY, KNEE, TOTAL
Anesthesia: Spinal | Site: Knee | Laterality: Left

## 2016-03-11 MED ORDER — CEFAZOLIN SODIUM-DEXTROSE 2-3 GM-% IV SOLR
INTRAVENOUS | Status: AC
  Filled 2016-03-11: qty 50

## 2016-03-11 MED ORDER — HYDROMORPHONE HCL 1 MG/ML IJ SOLN
0.2500 mg | INTRAMUSCULAR | Status: DC | PRN
  Administered 2016-03-11 (×4): 0.5 mg via INTRAVENOUS

## 2016-03-11 MED ORDER — ONDANSETRON HCL 4 MG PO TABS: 4.0000 mg | ORAL_TABLET | Freq: Four times a day (QID) | ORAL | Status: DC | PRN

## 2016-03-11 MED ORDER — SODIUM CHLORIDE 0.9 % IV SOLN
INTRAVENOUS | Status: DC
Start: 2016-03-11 — End: 2016-03-11

## 2016-03-11 MED ORDER — DIPHENHYDRAMINE HCL 50 MG/ML IJ SOLN
25.0000 mg | Freq: Once | INTRAMUSCULAR | Status: AC
  Administered 2016-03-11: 25 mg via INTRAVENOUS

## 2016-03-11 MED ORDER — PROPOFOL 10 MG/ML IV BOLUS
INTRAVENOUS | Status: AC
  Filled 2016-03-11: qty 20

## 2016-03-11 MED ORDER — LEVOTHYROXINE SODIUM 100 MCG PO TABS
100.0000 ug | ORAL_TABLET | Freq: Every day | ORAL | Status: DC
  Administered 2016-03-12 – 2016-03-13 (×2): 100 ug via ORAL
  Filled 2016-03-11 (×4): qty 1

## 2016-03-11 MED ORDER — METOCLOPRAMIDE HCL 5 MG/ML IJ SOLN: 5.0000 mg | Freq: Three times a day (TID) | INTRAMUSCULAR | Status: DC | PRN

## 2016-03-11 MED ORDER — PROPOFOL 10 MG/ML IV BOLUS
INTRAVENOUS | Status: DC | PRN
  Administered 2016-03-11 (×2): 20 mg via INTRAVENOUS

## 2016-03-11 MED ORDER — SODIUM CHLORIDE 0.9 % IJ SOLN
INTRAMUSCULAR | Status: DC | PRN
  Administered 2016-03-11: 30 mL

## 2016-03-11 MED ORDER — HYDROMORPHONE HCL 1 MG/ML IJ SOLN
INTRAMUSCULAR | Status: AC
  Filled 2016-03-11: qty 1

## 2016-03-11 MED ORDER — MORPHINE SULFATE (PF) 2 MG/ML IV SOLN
1.0000 mg | INTRAVENOUS | Status: DC | PRN
  Administered 2016-03-11: 1 mg via INTRAVENOUS
  Filled 2016-03-11: qty 1

## 2016-03-11 MED ORDER — TRANEXAMIC ACID 1000 MG/10ML IV SOLN
1000.0000 mg | Freq: Once | INTRAVENOUS | Status: DC
  Filled 2016-03-11: qty 10

## 2016-03-11 MED ORDER — CEFAZOLIN SODIUM-DEXTROSE 2-4 GM/100ML-% IV SOLN
2.0000 g | INTRAVENOUS | Status: AC
  Administered 2016-03-11: 2 g via INTRAVENOUS

## 2016-03-11 MED ORDER — LORATADINE 10 MG PO TABS
10.0000 mg | ORAL_TABLET | Freq: Every day | ORAL | Status: DC | PRN
  Filled 2016-03-11: qty 1

## 2016-03-11 MED ORDER — BISACODYL 10 MG RE SUPP: 10.0000 mg | Freq: Every day | RECTAL | Status: DC | PRN

## 2016-03-11 MED ORDER — ALBUTEROL SULFATE (2.5 MG/3ML) 0.083% IN NEBU: 2.5000 mg | INHALATION_SOLUTION | Freq: Four times a day (QID) | RESPIRATORY_TRACT | Status: DC | PRN

## 2016-03-11 MED ORDER — ACETAMINOPHEN 10 MG/ML IV SOLN
INTRAVENOUS | Status: AC
  Filled 2016-03-11: qty 100

## 2016-03-11 MED ORDER — LACTATED RINGERS IV SOLN
INTRAVENOUS | Status: DC | PRN
  Administered 2016-03-11 (×2): via INTRAVENOUS

## 2016-03-11 MED ORDER — LOSARTAN POTASSIUM 50 MG PO TABS
100.0000 mg | ORAL_TABLET | Freq: Every day | ORAL | Status: DC
  Administered 2016-03-12 – 2016-03-13 (×2): 100 mg via ORAL
  Filled 2016-03-11 (×2): qty 2

## 2016-03-11 MED ORDER — PROPOFOL 10 MG/ML IV BOLUS
INTRAVENOUS | Status: AC
  Filled 2016-03-11: qty 40

## 2016-03-11 MED ORDER — FLEET ENEMA 7-19 GM/118ML RE ENEM: 1.0000 | ENEMA | Freq: Once | RECTAL | Status: DC | PRN

## 2016-03-11 MED ORDER — LIDOCAINE HCL (CARDIAC) 20 MG/ML IV SOLN
INTRAVENOUS | Status: AC
  Filled 2016-03-11: qty 5

## 2016-03-11 MED ORDER — TRANEXAMIC ACID 1000 MG/10ML IV SOLN
1000.0000 mg | Freq: Once | INTRAVENOUS | Status: AC
  Administered 2016-03-11: 1000 mg via INTRAVENOUS
  Filled 2016-03-11: qty 10

## 2016-03-11 MED ORDER — OXYCODONE HCL 5 MG PO TABS
5.0000 mg | ORAL_TABLET | ORAL | Status: DC | PRN
  Administered 2016-03-11 – 2016-03-13 (×14): 10 mg via ORAL
  Filled 2016-03-11 (×15): qty 2

## 2016-03-11 MED ORDER — DEXAMETHASONE SODIUM PHOSPHATE 10 MG/ML IJ SOLN
10.0000 mg | Freq: Once | INTRAMUSCULAR | Status: AC
  Administered 2016-03-12: 10 mg via INTRAVENOUS
  Filled 2016-03-11: qty 1

## 2016-03-11 MED ORDER — FENTANYL CITRATE (PF) 100 MCG/2ML IJ SOLN
INTRAMUSCULAR | Status: DC | PRN
  Administered 2016-03-11: 100 ug via INTRAVENOUS

## 2016-03-11 MED ORDER — MIDAZOLAM HCL 2 MG/2ML IJ SOLN
INTRAMUSCULAR | Status: AC
  Filled 2016-03-11: qty 2

## 2016-03-11 MED ORDER — ACETAMINOPHEN 10 MG/ML IV SOLN
1000.0000 mg | Freq: Once | INTRAVENOUS | Status: AC
  Administered 2016-03-11: 1000 mg via INTRAVENOUS
  Filled 2016-03-11: qty 100

## 2016-03-11 MED ORDER — METHOCARBAMOL 500 MG PO TABS
500.0000 mg | ORAL_TABLET | Freq: Four times a day (QID) | ORAL | Status: DC | PRN
  Administered 2016-03-11 – 2016-03-13 (×5): 500 mg via ORAL
  Filled 2016-03-11 (×7): qty 1

## 2016-03-11 MED ORDER — POLYETHYLENE GLYCOL 3350 17 G PO PACK: 17.0000 g | PACK | Freq: Every day | ORAL | Status: DC | PRN

## 2016-03-11 MED ORDER — SODIUM CHLORIDE 0.9 % IV SOLN
INTRAVENOUS | Status: DC
  Administered 2016-03-11 – 2016-03-12 (×2): via INTRAVENOUS

## 2016-03-11 MED ORDER — METOCLOPRAMIDE HCL 10 MG PO TABS: 5.0000 mg | ORAL_TABLET | Freq: Three times a day (TID) | ORAL | Status: DC | PRN

## 2016-03-11 MED ORDER — LIDOCAINE HCL (CARDIAC) 20 MG/ML IV SOLN
INTRAVENOUS | Status: DC | PRN
  Administered 2016-03-11: 50 mg via INTRAVENOUS

## 2016-03-11 MED ORDER — MENTHOL 3 MG MT LOZG: 1.0000 | LOZENGE | OROMUCOSAL | Status: DC | PRN

## 2016-03-11 MED ORDER — ONDANSETRON HCL 4 MG/2ML IJ SOLN
INTRAMUSCULAR | Status: AC
  Filled 2016-03-11: qty 2

## 2016-03-11 MED ORDER — DEXAMETHASONE SODIUM PHOSPHATE 10 MG/ML IJ SOLN
INTRAMUSCULAR | Status: AC
  Filled 2016-03-11: qty 4

## 2016-03-11 MED ORDER — ROCURONIUM BROMIDE 100 MG/10ML IV SOLN
INTRAVENOUS | Status: AC
  Filled 2016-03-11: qty 3

## 2016-03-11 MED ORDER — TRANEXAMIC ACID 1000 MG/10ML IV SOLN
1000.0000 mg | INTRAVENOUS | Status: AC
  Administered 2016-03-11: 1000 mg via INTRAVENOUS
  Filled 2016-03-11: qty 10

## 2016-03-11 MED ORDER — SODIUM CHLORIDE 0.9 % IJ SOLN
INTRAMUSCULAR | Status: AC
  Filled 2016-03-11: qty 50

## 2016-03-11 MED ORDER — HYDROMORPHONE HCL 1 MG/ML IJ SOLN
0.5000 mg | INTRAMUSCULAR | Status: DC | PRN
  Administered 2016-03-11 – 2016-03-13 (×4): 0.5 mg via INTRAVENOUS
  Filled 2016-03-11 (×5): qty 1

## 2016-03-11 MED ORDER — MOMETASONE FURO-FORMOTEROL FUM 200-5 MCG/ACT IN AERO
2.0000 | INHALATION_SPRAY | Freq: Two times a day (BID) | RESPIRATORY_TRACT | Status: DC
  Filled 2016-03-11: qty 8.8

## 2016-03-11 MED ORDER — DIPHENHYDRAMINE HCL 50 MG/ML IJ SOLN
INTRAMUSCULAR | Status: AC
Start: 2016-03-11 — End: 2016-03-11
  Filled 2016-03-11: qty 1

## 2016-03-11 MED ORDER — MEPERIDINE HCL 50 MG/ML IJ SOLN
6.2500 mg | INTRAMUSCULAR | Status: DC | PRN
  Administered 2016-03-11 (×2): 12.5 mg via INTRAVENOUS

## 2016-03-11 MED ORDER — BUPIVACAINE LIPOSOME 1.3 % IJ SUSP
INTRAMUSCULAR | Status: DC | PRN
  Administered 2016-03-11: 20 mL

## 2016-03-11 MED ORDER — CEFAZOLIN SODIUM-DEXTROSE 2-4 GM/100ML-% IV SOLN
2.0000 g | Freq: Four times a day (QID) | INTRAVENOUS | Status: AC
  Administered 2016-03-11 (×2): 2 g via INTRAVENOUS
  Filled 2016-03-11 (×2): qty 100

## 2016-03-11 MED ORDER — DEXAMETHASONE SODIUM PHOSPHATE 10 MG/ML IJ SOLN
10.0000 mg | Freq: Once | INTRAMUSCULAR | Status: AC
  Administered 2016-03-11: 10 mg via INTRAVENOUS

## 2016-03-11 MED ORDER — BUPIVACAINE IN DEXTROSE 0.75-8.25 % IT SOLN
INTRATHECAL | Status: DC | PRN
  Administered 2016-03-11: 2 mL via INTRATHECAL

## 2016-03-11 MED ORDER — CHLORHEXIDINE GLUCONATE 4 % EX LIQD: 60.0000 mL | Freq: Once | CUTANEOUS | Status: DC

## 2016-03-11 MED ORDER — ACETAMINOPHEN 500 MG PO TABS
1000.0000 mg | ORAL_TABLET | Freq: Four times a day (QID) | ORAL | Status: AC
  Administered 2016-03-11 – 2016-03-12 (×4): 1000 mg via ORAL
  Filled 2016-03-11 (×4): qty 2

## 2016-03-11 MED ORDER — ONDANSETRON HCL 4 MG/2ML IJ SOLN
INTRAMUSCULAR | Status: DC | PRN
  Administered 2016-03-11: 4 mg via INTRAVENOUS

## 2016-03-11 MED ORDER — ALBUTEROL SULFATE HFA 108 (90 BASE) MCG/ACT IN AERS: 2.0000 | INHALATION_SPRAY | Freq: Four times a day (QID) | RESPIRATORY_TRACT | Status: DC | PRN

## 2016-03-11 MED ORDER — ONDANSETRON HCL 4 MG/2ML IJ SOLN: 4.0000 mg | Freq: Four times a day (QID) | INTRAMUSCULAR | Status: DC | PRN

## 2016-03-11 MED ORDER — PANTOPRAZOLE SODIUM 40 MG PO TBEC
40.0000 mg | DELAYED_RELEASE_TABLET | Freq: Every day | ORAL | Status: DC
  Filled 2016-03-11: qty 1

## 2016-03-11 MED ORDER — RIVAROXABAN 10 MG PO TABS
10.0000 mg | ORAL_TABLET | Freq: Every day | ORAL | Status: DC
  Administered 2016-03-12 – 2016-03-13 (×2): 10 mg via ORAL
  Filled 2016-03-11 (×4): qty 1

## 2016-03-11 MED ORDER — AMLODIPINE BESYLATE 5 MG PO TABS
5.0000 mg | ORAL_TABLET | Freq: Every day | ORAL | Status: DC
  Administered 2016-03-12 – 2016-03-13 (×2): 5 mg via ORAL
  Filled 2016-03-11 (×2): qty 1

## 2016-03-11 MED ORDER — ACETAMINOPHEN 325 MG PO TABS: 650.0000 mg | ORAL_TABLET | Freq: Four times a day (QID) | ORAL | Status: DC | PRN

## 2016-03-11 MED ORDER — BUPIVACAINE HCL (PF) 0.25 % IJ SOLN
INTRAMUSCULAR | Status: AC
  Filled 2016-03-11: qty 30

## 2016-03-11 MED ORDER — DULOXETINE HCL 30 MG PO CPEP
30.0000 mg | ORAL_CAPSULE | Freq: Every day | ORAL | Status: DC
  Administered 2016-03-12: 30 mg via ORAL
  Filled 2016-03-11 (×4): qty 1

## 2016-03-11 MED ORDER — PHENOL 1.4 % MT LIQD: 1.0000 | OROMUCOSAL | Status: DC | PRN

## 2016-03-11 MED ORDER — DEXTROSE 5 % IV SOLN
500.0000 mg | Freq: Four times a day (QID) | INTRAVENOUS | Status: DC | PRN
  Administered 2016-03-11 – 2016-03-12 (×2): 500 mg via INTRAVENOUS
  Filled 2016-03-11 (×4): qty 5

## 2016-03-11 MED ORDER — BUPIVACAINE HCL 0.25 % IJ SOLN
INTRAMUSCULAR | Status: DC | PRN
  Administered 2016-03-11: 20 mL

## 2016-03-11 MED ORDER — DIPHENHYDRAMINE HCL 12.5 MG/5ML PO ELIX
12.5000 mg | ORAL_SOLUTION | ORAL | Status: DC | PRN
  Administered 2016-03-11 – 2016-03-12 (×5): 25 mg via ORAL
  Filled 2016-03-11 (×5): qty 10

## 2016-03-11 MED ORDER — FENTANYL CITRATE (PF) 100 MCG/2ML IJ SOLN
INTRAMUSCULAR | Status: AC
  Filled 2016-03-11: qty 2

## 2016-03-11 MED ORDER — DOCUSATE SODIUM 100 MG PO CAPS
100.0000 mg | ORAL_CAPSULE | Freq: Two times a day (BID) | ORAL | Status: DC
  Administered 2016-03-11 – 2016-03-13 (×4): 100 mg via ORAL

## 2016-03-11 MED ORDER — ACETAMINOPHEN 650 MG RE SUPP: 650.0000 mg | Freq: Four times a day (QID) | RECTAL | Status: DC | PRN

## 2016-03-11 MED ORDER — PROPOFOL 500 MG/50ML IV EMUL
INTRAVENOUS | Status: DC | PRN
  Administered 2016-03-11: 75 ug/kg/min via INTRAVENOUS

## 2016-03-11 MED ORDER — MEPERIDINE HCL 50 MG/ML IJ SOLN
INTRAMUSCULAR | Status: AC
  Filled 2016-03-11: qty 1

## 2016-03-11 MED ORDER — MIDAZOLAM HCL 5 MG/5ML IJ SOLN
INTRAMUSCULAR | Status: DC | PRN
  Administered 2016-03-11: 2 mg via INTRAVENOUS

## 2016-03-11 MED ORDER — BUPIVACAINE LIPOSOME 1.3 % IJ SUSP
20.0000 mL | Freq: Once | INTRAMUSCULAR | Status: DC
  Filled 2016-03-11: qty 20

## 2016-03-11 MED ORDER — SODIUM CHLORIDE 0.9 % IR SOLN
Status: DC | PRN
  Administered 2016-03-11: 1000 mL

## 2016-03-11 SURGICAL SUPPLY — 50 items
BAG DECANTER FOR FLEXI CONT (MISCELLANEOUS) ×3 IMPLANT
BAG ZIPLOCK 12X15 (MISCELLANEOUS) ×3 IMPLANT
BANDAGE ACE 6X5 VEL STRL LF (GAUZE/BANDAGES/DRESSINGS) ×6 IMPLANT
BLADE SAG 18X100X1.27 (BLADE) ×3 IMPLANT
BLADE SAW SGTL 11.0X1.19X90.0M (BLADE) ×3 IMPLANT
BOWL SMART MIX CTS (DISPOSABLE) ×3 IMPLANT
CAPT KNEE TOTAL 3 ATTUNE ×3 IMPLANT
CEMENT HV SMART SET (Cement) ×6 IMPLANT
CLOSURE WOUND 1/2 X4 (GAUZE/BANDAGES/DRESSINGS) ×1
CLOTH BEACON ORANGE TIMEOUT ST (SAFETY) ×3 IMPLANT
CUFF TOURN SGL QUICK 34 (TOURNIQUET CUFF) ×2
CUFF TRNQT CYL 34X4X40X1 (TOURNIQUET CUFF) ×1 IMPLANT
DECANTER SPIKE VIAL GLASS SM (MISCELLANEOUS) ×3 IMPLANT
DRAPE U-SHAPE 47X51 STRL (DRAPES) ×3 IMPLANT
DRSG ADAPTIC 3X8 NADH LF (GAUZE/BANDAGES/DRESSINGS) ×3 IMPLANT
DRSG PAD ABDOMINAL 8X10 ST (GAUZE/BANDAGES/DRESSINGS) ×3 IMPLANT
DURAPREP 26ML APPLICATOR (WOUND CARE) ×3 IMPLANT
ELECT REM PT RETURN 9FT ADLT (ELECTROSURGICAL) ×3
ELECTRODE REM PT RTRN 9FT ADLT (ELECTROSURGICAL) ×1 IMPLANT
EVACUATOR 1/8 PVC DRAIN (DRAIN) ×3 IMPLANT
GAUZE SPONGE 4X4 12PLY STRL (GAUZE/BANDAGES/DRESSINGS) ×3 IMPLANT
GLOVE BIO SURGEON STRL SZ7.5 (GLOVE) IMPLANT
GLOVE BIO SURGEON STRL SZ8 (GLOVE) ×3 IMPLANT
GLOVE BIOGEL PI IND STRL 6.5 (GLOVE) IMPLANT
GLOVE BIOGEL PI IND STRL 8 (GLOVE) ×1 IMPLANT
GLOVE BIOGEL PI INDICATOR 6.5 (GLOVE)
GLOVE BIOGEL PI INDICATOR 8 (GLOVE) ×2
GLOVE SURG SS PI 6.5 STRL IVOR (GLOVE) IMPLANT
GOWN STRL REUS W/TWL LRG LVL3 (GOWN DISPOSABLE) ×3 IMPLANT
GOWN STRL REUS W/TWL XL LVL3 (GOWN DISPOSABLE) IMPLANT
HANDPIECE INTERPULSE COAX TIP (DISPOSABLE) ×2
IMMOBILIZER KNEE 20 (SOFTGOODS) ×3
IMMOBILIZER KNEE 20 THIGH 36 (SOFTGOODS) ×1 IMPLANT
MANIFOLD NEPTUNE II (INSTRUMENTS) ×3 IMPLANT
NS IRRIG 1000ML POUR BTL (IV SOLUTION) ×3 IMPLANT
PACK TOTAL KNEE CUSTOM (KITS) ×3 IMPLANT
PADDING CAST COTTON 6X4 STRL (CAST SUPPLIES) ×6 IMPLANT
POSITIONER SURGICAL ARM (MISCELLANEOUS) ×3 IMPLANT
SET HNDPC FAN SPRY TIP SCT (DISPOSABLE) ×1 IMPLANT
STRIP CLOSURE SKIN 1/2X4 (GAUZE/BANDAGES/DRESSINGS) ×2 IMPLANT
SUT MNCRL AB 4-0 PS2 18 (SUTURE) ×3 IMPLANT
SUT VIC AB 2-0 CT1 27 (SUTURE) ×6
SUT VIC AB 2-0 CT1 TAPERPNT 27 (SUTURE) ×3 IMPLANT
SUT VLOC 180 0 24IN GS25 (SUTURE) ×3 IMPLANT
SYR 50ML LL SCALE MARK (SYRINGE) ×3 IMPLANT
TRAY FOLEY W/METER SILVER 14FR (SET/KITS/TRAYS/PACK) ×3 IMPLANT
TRAY FOLEY W/METER SILVER 16FR (SET/KITS/TRAYS/PACK) ×3 IMPLANT
WATER STERILE IRR 1500ML POUR (IV SOLUTION) ×3 IMPLANT
WRAP KNEE MAXI GEL POST OP (GAUZE/BANDAGES/DRESSINGS) ×3 IMPLANT
YANKAUER SUCT BULB TIP 10FT TU (MISCELLANEOUS) ×3 IMPLANT

## 2016-03-11 NOTE — Anesthesia Preprocedure Evaluation (Signed)
Anesthesia Evaluation  Patient identified by MRN, date of birth, ID band Patient awake    Reviewed: Allergy & Precautions, NPO status , Patient's Chart, lab work & pertinent test results  Airway Mallampati: II  TM Distance: >3 FB Neck ROM: Full    Dental  (+) Teeth Intact   Pulmonary asthma ,    breath sounds clear to auscultation       Cardiovascular hypertension, Pt. on medications  Rhythm:Regular Rate:Normal     Neuro/Psych negative neurological ROS  negative psych ROS   GI/Hepatic negative GI ROS, Neg liver ROS,   Endo/Other  Hypothyroidism   Renal/GU negative Renal ROS  negative genitourinary   Musculoskeletal  (+) Arthritis , Osteoarthritis,    Abdominal   Peds negative pediatric ROS (+)  Hematology negative hematology ROS (+)   Anesthesia Other Findings   Reproductive/Obstetrics negative OB ROS                             Lab Results  Component Value Date   WBC 7.9 03/04/2016   HGB 12.0 03/04/2016   HCT 36.9 03/04/2016   MCV 86.4 03/04/2016   PLT 323 03/04/2016   Lab Results  Component Value Date   INR 1.18 03/04/2016   INR 1.05 12/15/2015     Anesthesia Physical  Anesthesia Plan  ASA: III  Anesthesia Plan: Spinal   Post-op Pain Management:    Induction:   Airway Management Planned:   Additional Equipment:   Intra-op Plan:   Post-operative Plan:   Informed Consent: I have reviewed the patients History and Physical, chart, labs and discussed the procedure including the risks, benefits and alternatives for the proposed anesthesia with the patient or authorized representative who has indicated his/her understanding and acceptance.   Dental advisory given  Plan Discussed with: CRNA  Anesthesia Plan Comments:         Anesthesia Quick Evaluation

## 2016-03-11 NOTE — Progress Notes (Signed)
PT Cancellation Note  Patient Details Name: Rachael ChangJudy A Shelton MRN: [id number] DOB: September 04, 1961   Cancelled Treatment:    Reason Eval/Treat Not Completed: Medical issues which prohibited therapy (patient in CPM until 5:30 and having pain issues. will check back in AM.)   Rada HayHill, Mylisa Brunson Elizabeth 03/11/2016, 4:21 PM Blanchard KelchKaren Carolos Fecher PT [phone]

## 2016-03-11 NOTE — Anesthesia Procedure Notes (Signed)
Spinal Patient location during procedure: OR Staffing Anesthesiologist: Khadejah Son Performed by: anesthesiologist  Preanesthetic Checklist Completed: patient identified, site marked, surgical consent, pre-op evaluation, timeout performed, IV checked, risks and benefits discussed and monitors and equipment checked Spinal Block Patient position: sitting Prep: Betadine Patient monitoring: heart rate, continuous pulse ox and blood pressure Approach: left paramedian Location: L3-4 Injection technique: single-shot Needle Needle type: Sprotte  Needle gauge: 24 G Needle length: 9 cm Additional Notes Expiration date of kit checked and confirmed. Patient tolerated procedure well, without complications.     

## 2016-03-11 NOTE — Op Note (Signed)
Pre-operative diagnosis- Osteoarthritis  Left knee(s)  Post-operative diagnosis- Osteoarthritis Left knee(s)  Procedure-  Left  Total Knee Arthroplasty  Surgeon- Gus RankinFrank V. Delia Slatten, MD  Assistant- Avel Peacerew Perkins, PA-C   Anesthesia-  Spinal  EBL-* No blood loss amount entered *   Drains Hemovac  Tourniquet time-  Total Tourniquet Time Documented: Thigh (Left) - 43 minutes Total: Thigh (Left) - 43 minutes     Complications- None  Condition-PACU - hemodynamically stable.   Brief Clinical Note  Rachael ChangJudy A Shelton is a 55 y.o. year old female with end stage OA of her left knee with progressively worsening pain and dysfunction. She has constant pain, with activity and at rest and significant functional deficits with difficulties even with ADLs. She has had extensive non-op management including analgesics, injections of cortisone, and home exercise program, but remains in significant pain with significant dysfunction. Radiographs show bone on bone arthritis medial and patellofemoral with tibial subluxation. She presents now for left Total Knee Arthroplasty.    Procedure in detail---   The patient is brought into the operating room and positioned supine on the operating table. After successful administration of  Spinal,   a tourniquet is placed high on the  Left thigh(s) and the lower extremity is prepped and draped in the usual sterile fashion. Time out is performed by the operating team and then the  Left lower extremity is wrapped in Esmarch, knee flexed and the tourniquet inflated to 300 mmHg.       A midline incision is made with a ten blade through the subcutaneous tissue to the level of the extensor mechanism. A fresh blade is used to make a medial parapatellar arthrotomy. Soft tissue over the proximal medial tibia is subperiosteally elevated to the joint line with a knife and into the semimembranosus bursa with a Cobb elevator. Soft tissue over the proximal lateral tibia is elevated with  attention being paid to avoiding the patellar tendon on the tibial tubercle. The patella is everted, knee flexed 90 degrees and the ACL and PCL are removed. Findings are bone on bone all 3 compartments with massive global osteophytes.        The drill is used to create a starting hole in the distal femur and the canal is thoroughly irrigated with sterile saline to remove the fatty contents. The 5 degree Left  valgus alignment guide is placed into the femoral canal and the distal femoral cutting block is pinned to remove 9 mm off the distal femur. Resection is made with an oscillating saw.      The tibia is subluxed forward and the menisci are removed. The extramedullary alignment guide is placed referencing proximally at the medial aspect of the tibial tubercle and distally along the second metatarsal axis and tibial crest. The block is pinned to remove 2mm off the more deficient medial  side. Resection is made with an oscillating saw. Size 4is the most appropriate size for the tibia and the proximal tibia is prepared with the modular drill and keel punch for that size.      The femoral sizing guide is placed and size 5 is most appropriate. Rotation is marked off the epicondylar axis and confirmed by creating a rectangular flexion gap at 90 degrees. The size 5 cutting block is pinned in this rotation and the anterior, posterior and chamfer cuts are made with the oscillating saw. The intercondylar block is then placed and that cut is made.      Trial size 4 tibial  component, trial size 5 narrow posterior stabilized femur and a 10  mm posterior stabilized rotating platform insert trial is placed. Full extension is achieved with excellent varus/valgus and anterior/posterior balance throughout full range of motion. The patella is everted and thickness measured to be 22  mm. Free hand resection is taken to 12 mm, a 35 template is placed, lug holes are drilled, trial patella is placed, and it tracks normally.  Osteophytes are removed off the posterior femur with the trial in place. All trials are removed and the cut bone surfaces prepared with pulsatile lavage. Cement is mixed and once ready for implantation, the size 4 tibial implant, size  5 narrow posterior stabilized femoral component, and the size 35 patella are cemented in place and the patella is held with the clamp. The trial insert is placed and the knee held in full extension. The Exparel (20 ml mixed with 30 ml saline) and .25% Bupivicaine, are injected into the extensor mechanism, posterior capsule, medial and lateral gutters and subcutaneous tissues.  All extruded cement is removed and once the cement is hard the permanent 10 mm posterior stabilized rotating platform insert is placed into the tibial tray.      The wound is copiously irrigated with saline solution and the extensor mechanism closed over a hemovac drain with #1 V-loc suture. The tourniquet is released for a total tourniquet time of 43  minutes. Flexion against gravity is 140 degrees and the patella tracks normally. Subcutaneous tissue is closed with 2.0 vicryl and subcuticular with running 4.0 Monocryl. The incision is cleaned and dried and steri-strips and a bulky sterile dressing are applied. The limb is placed into a knee immobilizer and the patient is awakened and transported to recovery in stable condition.      Please note that a surgical assistant was a medical necessity for this procedure in order to perform it in a safe and expeditious manner. Surgical assistant was necessary to retract the ligaments and vital neurovascular structures to prevent injury to them and also necessary for proper positioning of the limb to allow for anatomic placement of the prosthesis.   Gus Rankin Kaveh Kissinger, MD    03/11/2016, 10:12 AM

## 2016-03-11 NOTE — Transfer of Care (Signed)
Immediate Anesthesia Transfer of Care Note  Patient: Rachael Shelton  Procedure(s) Performed: Procedure(s): LEFT TOTAL KNEE ARTHROPLASTY (Left)  Patient Location: PACU  Anesthesia Type:Spinal  Level of Consciousness: awake, alert  and oriented  Airway & Oxygen Therapy: Patient Spontanous Breathing and Patient connected to face mask oxygen  Post-op Assessment: Report given to RN  Post vital signs: Reviewed and stable  Last Vitals:  Filed Vitals:   03/11/16 0626  BP: 148/76  Pulse: 100  Temp: 36.8 C  Resp: 18    Complications: No apparent anesthesia complications

## 2016-03-11 NOTE — Interval H&P Note (Signed)
History and Physical Interval Note:  03/11/2016 6:43 AM  Rachael Shelton  has presented today for surgery, with the diagnosis of LEFT KNEE OA   The various methods of treatment have been discussed with the patient and family. After consideration of risks, benefits and other options for treatment, the patient has consented to  Procedure(s): LEFT TOTAL KNEE ARTHROPLASTY (Left) as a surgical intervention .  The patient's history has been reviewed, patient examined, no change in status, stable for surgery.  I have reviewed the patient's chart and labs.  Questions were answered to the patient's satisfaction.     Loanne DrillingALUISIO,Mills Mitton V

## 2016-03-11 NOTE — Progress Notes (Signed)
Pt refuse MDI Dulera. Pt states that she uses her Advair at home once a day in the morning and she perfers to continue to using that med. Pt states that she brought her med from home. Pt is stable at this time no distress or complications noted.

## 2016-03-11 NOTE — H&P (View-Only) (Signed)
Rachael Shelton DOB: [birth year] Married / Language: English / Race: White Female Date of Admission:  03/11/2016 CC:  Left Knee Pain History of Present Illness The patient is a 55 year old female who  in today for a preoperative History and Physical. The patient is scheduled for a left total knee arthroplasty to be performed by Dr. Gus Rankin. Aluisio, MD at [facility] on 03/11/2016. The patient is a 55 year old female who comes in a few months out from right total knee arthroplasty. The patient states that she is doing well (swelling/warm) at this time. The pain is under fair control at this time and describe their pain as mild. They are currently on Ultram for their pain. The patient feels that they are progressing well at this time. The patient is also being followed for their left knee pain and osteoarthritis. They are now year(s) out from when symptoms began. Symptoms reported include: pain, swelling, popping and grinding. The patient feels that they are doing poorly and report their pain level to be moderate to severe. Current treatment includes: NSAIDs. The following medication has been used for pain control: antiinflammatory medication and Ultram. The patient has reported improvement of their symptoms with: Cortisone injections (relief for a couple of months). She is real pleased with how her right knee is doing. She is making excellent progress. Pain has definitely greatly improved. Left knee is what is giving her the most problem. She has documented advanced arthritis of the left knee also. She has had cortisone which has helped for only short amounts of time. She is ready to proceed with the left knee replacement as she feels like she has recovered extremely well on the right. They have been treated conservatively in the past for the above stated problem and despite conservative measures, they continue to have progressive pain and severe functional limitations and dysfunction. They have failed  non-operative management including home exercise, medications, and injections. It is felt that they would benefit from undergoing total joint replacement. Risks and benefits of the procedure have been discussed with the patient and they elect to proceed with surgery. There are no active contraindications to surgery such as ongoing infection or rapidly progressive neurological disease.   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 OxyCODONE HCl *ANALGESICS - OPIOID*  Slight Itching  Family History Chronic Obstructive Lung Disease  Mother. Osteoarthritis  Father, Maternal Grandmother, Mother. Heart Disease  Paternal Grandfather. Cancer  Father, Maternal Grandmother. Cerebrovascular Accident  Maternal Grandfather. Drug / Alcohol Addiction  Father.  Social History  Current drinker  05/30/2015: Currently drinks beer, wine and hard liquor only occasionally per week Current work status  working full time Not under pain contract  Number of flights of stairs before winded  2-3 Tobacco use  Never smoker. 05/30/2015 Exercise  Exercises weekly; does running / walking and other Living situation  live with spouse No history of drug/alcohol rehab  Children  1 Marital status  married Post-Surgical Plans  Home with Husband  Medication History Centrum (Oral) Active. Voltaren (  Tablet DR, Oral) Active. Tylenol (  Capsule,  Oral) Active. Vitamin D3 Specific strength unknown - Active. DULoxetine HCl (Oral)  Specific strength unknown - Active. (per PCP Cymbalta generic) Omega 3 (Oral) Specific strength unknown - Active. Magnesium Gluconate (Oral) Specific strength unknown - Active. Advair Diskus (Inhalation) Specific strength unknown - Active. Albuterol Sulfate (Inhalation) Specific strength unknown - Active. (prn) Levothyroxine Sodium (Oral) Specific strength unknown - Active. Losartan Potassium (Oral) Specific strength unknown - Active. Omeprazole (Oral) Specific strength unknown - Active. ZyrTEC (Oral As Needed) Specific strength unknown - Active. (prn)  Past Surgical History Sinus Surgery  Straighten Nasal Septum  Carpal Tunnel Repair  bilateral Arthroscopy of Knee  right Dilation and Curettage of Uterus - Multiple  Cesarean Delivery  1 time Total Knee Replacement - Right   Review of Systems  General Not Present- Chills, Fatigue, Fever, Memory Loss, Night Sweats, Weight Gain and Weight Loss. Skin Not Present- Eczema, Hives, Itching, Lesions and Rash. HEENT Not Present- Dentures, Double Vision, Headache, Hearing Loss, Tinnitus and Visual Loss. Respiratory Not Present- Allergies, Chronic Cough, Coughing up blood, Shortness of breath at rest and Shortness of breath with exertion. Cardiovascular Not Present- Chest Pain, Difficulty Breathing Lying Down, Murmur, Palpitations, Racing/skipping heartbeats and Swelling. Gastrointestinal Not Present- Abdominal Pain, Bloody Stool, Constipation, Diarrhea, Difficulty Swallowing, Heartburn, Jaundice, Loss of appetitie, Nausea and Vomiting. Female Genitourinary Not Present- Blood in Urine, Discharge, Flank Pain, Incontinence, Painful Urination, Urgency, Urinary frequency, Urinary Retention, Urinating at Night and Weak urinary stream. Musculoskeletal Present- Joint Pain. Not Present- Back Pain, Joint Swelling, Morning Stiffness, Muscle Pain, Muscle Weakness and Spasms. Neurological Not Present- Blackout spells, Difficulty with balance,  Dizziness, Paralysis, Tremor and Weakness. Psychiatric Not Present- Insomnia.  Vitals Weight: 210 lb Height: 62in Weight was reported by patient. Height was reported by patient. Body Surface Area: 1.95 m Body Mass Index: 38.41 kg/m  Pulse: 88 (Regular)  BP: 128/78 (Sitting, Left Arm, Standard)  Physical Exam General Mental Status -Alert, cooperative and good historian. General Appearance-pleasant, Not in acute distress. Orientation-Oriented X3. Build & Nutrition-Well nourished and Well developed.  Head and Neck Head-normocephalic, atraumatic . Neck Global Assessment - supple, no bruit auscultated on the right, no bruit auscultated on the left.  Eye Pupil - Bilateral-Regular and Round. Motion - Bilateral-EOMI.  Chest and Lung Exam Auscultation Breath sounds - clear at anterior chest wall and clear at posterior chest wall. Adventitious sounds - Inspiratory wheeze - Right Lower Lobe (Posterior)(slight end inspiratory) and Left Lower Lobe (Posterior)(slight end inspiratory).  Cardiovascular Auscultation Rhythm - Regular rate and rhythm. Heart Sounds - S1 WNL and S2 WNL. Murmurs & Other Heart Sounds - Auscultation of the heart reveals - No Murmurs.  Abdomen Palpation/Percussion Tenderness - Abdomen is non-tender to palpation. Rigidity (guarding) - Abdomen is soft. Auscultation Auscultation of the abdomen reveals - Bowel sounds normal.  Female Genitourinary Note: Not done, not pertinent to present illness   Musculoskeletal Note: On exam, she is alert and oriented, in no apparent distress. Her left knee shows no effusion. Range about 5 to 125. Marked crepitus on range of motion. Tender in medial greater than lateral. No instability. Right knee; minimal swelling. Range of motion of the right knee is 0 to 112 degrees, and no tenderness or instability. She is walking with minimal limp.  RADIOGRAPHS AP and lateral of the right knee show her  prosthesis in excellent position with no periprosthetic abnormalities. On the left, she has bone-on-bone arthritis in the medial and patellofemoral compartments with significant tibial subluxation.   Assessment & Plan  Status post total right knee replacement (Z61.096(Z96.651) Primary osteoarthritis of left knee (M17.12)  Note:Surgical Plans: Left Total Knee Replacement  Disposition: Home  PCP: Dr. Maryelizabeth RowanElizabeth Dewey  IV TXA  Anesthesia Issues: None  Signed electronically by Beckey RutterAlezandrew L Perkins, III PA-C

## 2016-03-11 NOTE — Anesthesia Postprocedure Evaluation (Signed)
Anesthesia Post Note  Patient: Rachael Shelton  Procedure(s) Performed: Procedure(s) (LRB): LEFT TOTAL KNEE ARTHROPLASTY (Left)  Patient location during evaluation: PACU Anesthesia Type: Spinal Level of consciousness: oriented and awake and alert Pain management: pain level controlled Vital Signs Assessment: post-procedure vital signs reviewed and stable Respiratory status: spontaneous breathing, respiratory function stable and patient connected to nasal cannula oxygen Cardiovascular status: blood pressure returned to baseline and stable Postop Assessment: no headache and no backache Anesthetic complications: no    Last Vitals:  Filed Vitals:   03/11/16 1437 03/11/16 1539  BP: 157/77 146/74  Pulse: 111 109  Temp: 36.8 C 36.8 C  Resp: 17 16    Last Pain:  Filed Vitals:   03/11/16 1541  PainSc: 7                  [place] [employer]

## 2016-03-12 LAB — CBC
HEMATOCRIT: 33.7 % — AB (ref 36.0–46.0)
HEMOGLOBIN: 11.1 g/dL — AB (ref 12.0–15.0)
MCH: 28.2 pg (ref 26.0–34.0)
MCHC: 32.9 g/dL (ref 30.0–36.0)
MCV: 85.8 fL (ref 78.0–100.0)
Platelets: 344 10*3/uL (ref 150–400)
RBC: 3.93 MIL/uL (ref 3.87–5.11)
RDW: 14.3 % (ref 11.5–15.5)
WBC: 15.9 10*3/uL — ABNORMAL HIGH (ref 4.0–10.5)

## 2016-03-12 LAB — BASIC METABOLIC PANEL
Anion gap: 9 (ref 5–15)
BUN: 15 mg/dL (ref 6–20)
CHLORIDE: 105 mmol/L (ref 101–111)
CO2: 25 mmol/L (ref 22–32)
Calcium: 8.7 mg/dL — ABNORMAL LOW (ref 8.9–10.3)
Creatinine, Ser: 0.99 mg/dL (ref 0.44–1.00)
GFR calc Af Amer: >60 mL/min (ref >60)
GFR calc non Af Amer: >60 mL/min (ref >60)
GLUCOSE: 211 mg/dL — AB (ref 65–99)
POTASSIUM: 4.2 mmol/L (ref 3.5–5.1)
SODIUM: 139 mmol/L (ref 135–145)

## 2016-03-12 MED ORDER — OXYCODONE HCL 5 MG PO TABS: 5.0000 mg | ORAL_TABLET | ORAL | Status: AC | PRN

## 2016-03-12 MED ORDER — OMEPRAZOLE 20 MG PO CPDR
20.0000 mg | DELAYED_RELEASE_CAPSULE | Freq: Every day | ORAL | Status: DC
  Administered 2016-03-12 – 2016-03-13 (×2): 20 mg via ORAL
  Filled 2016-03-12 (×2): qty 1

## 2016-03-12 MED ORDER — RIVAROXABAN 10 MG PO TABS: 10.0000 mg | ORAL_TABLET | Freq: Every day | ORAL | Status: AC

## 2016-03-12 MED ORDER — METHOCARBAMOL 500 MG PO TABS: 500.0000 mg | ORAL_TABLET | Freq: Four times a day (QID) | ORAL | Status: AC | PRN

## 2016-03-12 NOTE — Evaluation (Addendum)
Physical Therapy Evaluation Patient Details Name: Rachael ChangJudy A Ionescu MRN: [id number] DOB: [birth year] Today's Date: 03/12/2016   History of Present Illness  55 yo female s/p L TKA 03/11/16.   Clinical Impression  On eval, pt was Min guard assist for mobility-walked ~100 feet with RW. Pain rated 5/10 with activity. Will follow and progress activity as tolerated. O2 sats 96% on RA, HR 114 bpm after walk.    Follow Up Recommendations Home health PT    Equipment Recommendations  None recommended by PT    Recommendations for Other Services       Precautions / Restrictions Precautions Precautions: Fall Required Braces or Orthoses: Knee Immobilizer - Left Knee Immobilizer - Left: Discontinue once straight leg raise with < 10 degree lag (KI soiled this am. Ortho tech paged by Diplomatic Services operational officersecretary for new one. ) Restrictions Weight Bearing Restrictions: No LLE Weight Bearing: Weight bearing as tolerated      Mobility  Bed Mobility               General bed mobility comments: oob in recliner  Transfers Overall transfer level: Needs assistance Equipment used: Rolling walker (2 wheeled) Transfers: Sit to/from Stand Sit to Stand: Min guard         General transfer comment: close guard for safety. VCs hand/LE placement  Ambulation/Gait Ambulation/Gait assistance: Min guard Ambulation Distance (Feet): 100 Feet Assistive device: Rolling walker (2 wheeled) Gait Pattern/deviations: Step-to pattern;Antalgic     General Gait Details: close guard for safety. slow gait speed. No knee buckling noted.   Stairs            Wheelchair Mobility    Modified Rankin (Stroke Patients Only)       Balance                                             Pertinent Vitals/Pain Pain Assessment: 0-10 Pain Score: 5  Pain Location: L knee Pain Descriptors / Indicators: Burning;Aching Pain Intervention(s): Monitored during session;Ice applied;Repositioned    Home Living  Family/patient expects to be discharged to:: Private residence Living Arrangements: Spouse/significant other Available Help at Discharge: Family Type of Home: House Home Access: Stairs to enter   Secretary/administratorntrance Stairs-Number of Steps: 1 Home Layout: Two level Home Equipment: Environmental consultantWalker - 2 wheels;Cane - single point;Crutches;Bedside commode      Prior Function Level of Independence: Independent               Hand Dominance        Extremity/Trunk Assessment   Upper Extremity Assessment: Defer to OT evaluation           Lower Extremity Assessment: LLE deficits/detail   LLE Deficits / Details: Strength ~3-/5.   Cervical / Trunk Assessment: Normal  Communication   Communication: No difficulties  Cognition Arousal/Alertness: Awake/alert Behavior During Therapy: WFL for tasks assessed/performed Overall Cognitive Status: Within Functional Limits for tasks assessed                      General Comments      Exercises Total Joint Exercises Ankle Circles/Pumps: AROM;Both;10 reps;Supine Quad Sets: AROM;Both;10 reps;Supine Heel Slides: AAROM;Left;10 reps;Supine Hip ABduction/ADduction: AAROM;Left;10 reps;Supine Straight Leg Raises: AAROM;Left;10 reps;Supine Goniometric ROM: ~10-55 degrees      Assessment/Plan    PT Assessment Patient needs continued PT services  PT Diagnosis Difficulty walking;Acute pain   PT  Problem List Decreased strength;Decreased range of motion;Decreased activity tolerance;Decreased balance;Decreased mobility;Obesity;Decreased knowledge of use of DME;Pain  PT Treatment Interventions DME instruction;Gait training;Functional mobility training;Balance training;Therapeutic exercise;Therapeutic activities;Stair training;Patient/family education   PT Goals (Current goals can be found in the Care Plan section) Acute Rehab PT Goals Patient Stated Goal: none stated PT Goal Formulation: With patient Time For Goal Achievement: 03/19/16 Potential to  Achieve Goals: Good    Frequency 7X/week   Barriers to discharge        Co-evaluation               End of Session   Activity Tolerance: Patient tolerated treatment well Patient left: in chair;with call bell/phone within reach;with family/visitor present           Time: 0912-0934 PT Time Calculation (min) (ACUTE ONLY): 22 min   Charges:   PT Evaluation $PT Eval Low Complexity: 1 Procedure     PT G Codes:         Rebeca Alert, MPT Pager: [phone]

## 2016-03-12 NOTE — Evaluation (Signed)
Occupational Therapy Evaluation AND Discharge  Patient Details Name: Rachael Shelton MRN: [id number] DOB: [birth year] Today's Date: 03/12/2016    History of Present Illness 55 yo female s/p L TKA 03/11/16.    Clinical Impression   Patient admitted with above. Patient independent PTA. Pt with R TKA in January 2017. Patient currently functioning at an overall mod I level, requiring some assistance with LB ADLs.  No additional OT needs identified, D/C from acute OT services and no additional follow-up OT needs at this time. All appropriate education provided to patient and family (spouse). Please re-order OT if needed.      Follow Up Recommendations  No OT follow up;Supervision - Intermittent    Equipment Recommendations  None recommended by OT    Recommendations for Other Services  None at this time    Precautions / Restrictions Precautions Precautions: Fall Required Braces or Orthoses: Knee Immobilizer - Left Knee Immobilizer - Left: Discontinue once straight leg raise with < 10 degree lag (KI soiled this am, PT paged ortho tech for new one) Restrictions Weight Bearing Restrictions: Yes LLE Weight Bearing: Weight bearing as tolerated    Mobility Bed Mobility General bed mobility comments: oob in recliner  Transfers Overall transfer level: Modified independent Equipment used: Rolling walker (2 wheeled) Transfers: Sit to/from Stand Sit to Stand: Modified independent (Device/Increase time) General transfer comment: close guard for safety. VCs hand/LE placement    Balance Overall balance assessment: Needs assistance Sitting-balance support: No upper extremity supported;Feet supported Sitting balance-Leahy Scale: Normal     Standing balance support: Bilateral upper extremity supported;During functional activity Standing balance-Leahy Scale: Good Standing balance comment: reliance on RW for support and safety    ADL Overall ADL's : Modified independent General ADL Comments: Pt  using RW and DME prn to increase safety and independence. Pt overall mod I, however requires some assistance with LB ADLs due to recent surgery. Encouraged pt to work on reaching bilateral feet so she can increase independence with LB ADLs. Pt states spouse can assist prn. Educated pt on anterior/posterior technique for getting in/out of the shower for safety while using RW, pt practiced this transfer and is able to complete at a mod I level.     Vision Vision Assessment?: No apparent visual deficits          Pertinent Vitals/Pain Pain Assessment: Faces Pain Score: 5  Faces Pain Scale: Hurts even more Pain Location: L knee Pain Descriptors / Indicators: Aching;Guarding Pain Intervention(s): Limited activity within patient's tolerance;Monitored during session;Repositioned;RN gave pain meds during session;Ice applied    Hand Dominance Right   Extremity/Trunk Assessment Upper Extremity Assessment Upper Extremity Assessment: Overall WFL for tasks assessed   Lower Extremity Assessment Lower Extremity Assessment: Defer to PT evaluation LLE Deficits / Details: Strength ~3-/5.    Cervical / Trunk Assessment Cervical / Trunk Assessment: Normal   Communication Communication Communication: No difficulties   Cognition Arousal/Alertness: Awake/alert Behavior During Therapy: WFL for tasks assessed/performed Overall Cognitive Status: Within Functional Limits for tasks assessed              Home Living Family/patient expects to be discharged to:: Private residence Living Arrangements: Spouse/significant other Available Help at Discharge: Family;Available 24 hours/day Type of Home: House Home Access: Stairs to enter [employer] of Steps: 1   Home Layout: Two level Alternate Level Stairs-Number of Steps: 14 Alternate Level Stairs-Rails: Right Bathroom Shower/Tub: Walk-in shower;Door   [employer] Toilet: Standard     Home Equipment: Environmental consultant - 2 wheels;Cane -  single  point;Crutches;Bedside commode;Shower seat - built in    Prior Functioning/Environment Level of Independence: Independent     OT Diagnosis: Generalized weakness;Acute pain   OT Problem List:   N/a, no acute OT needs identified at this time     OT Treatment/Interventions:   N/a, no acute OT needs identified at this time     OT Goals(Current goals can be found in the care plan section) Acute Rehab OT Goals Patient Stated Goal: go home tomorrow OT Goal Formulation: All assessment and education complete, DC therapy  OT Frequency:   N/a, no acute OT needs identified at this time     Barriers to D/C:  None known at this time    End of Session Equipment Utilized During Treatment: Rolling walker CPM Left Knee CPM Left Knee: Off  Activity Tolerance: Patient tolerated treatment well Patient left: in chair;with call bell/phone within reach;with family/visitor present   Time: [id number] OT Time Calculation (min): 15 min Charges:  OT General Charges $OT Visit: 1 Procedure OT Evaluation $OT Eval Low Complexity: 1 Procedure  Edwin CapPatricia Waneta Fitting , MS, OTR/L, CLT Pager: [phone]  03/12/2016, 10:17 AM

## 2016-03-12 NOTE — Discharge Summary (Signed)
Physician Discharge Summary   Patient ID: Rachael Shelton MRN: [id number] DOB/AGE: 55-Dec-1962 55 y.o.  Admit date: 03/11/2016 Discharge date: 03/13/2016  Primary Diagnosis:  Osteoarthritis Left knee(s) Admission Diagnoses:  Past Medical History  Diagnosis Date  . Hypothyroidism   . Asthma     environmental elements-seasonal alleries/ asthma  . Hypertension   . Bilateral dry eyes     tx. OTC  med used  . Arthritis     osteoarthritis- knees, ankylosing spondylosis back   Discharge Diagnoses:   Active Problems:   OA (osteoarthritis) of knee  Estimated body mass index is 40.04 kg/(m^2) as calculated from the following:   Height as of this encounter: 5' 1.5" (1.562 m).   Weight as of this encounter: 97.693 kg (215 lb 6 oz).  Procedure:  Procedure(s) (LRB): LEFT TOTAL KNEE ARTHROPLASTY (Left)   Consults: None  HPI: Rachael Shelton is a 55 y.o. year old female with end stage OA of her left knee with progressively worsening pain and dysfunction. She has constant pain, with activity and at rest and significant functional deficits with difficulties even with ADLs. She has had extensive non-op management including analgesics, injections of cortisone, and home exercise program, but remains in significant pain with significant dysfunction. Radiographs show bone on bone arthritis medial and patellofemoral with tibial subluxation. She presents now for left Total Knee Arthroplasty.  Laboratory Data: Admission on 03/11/2016  Component Date Value Ref Range Status  . WBC 03/12/2016 15.9* 4.0 - 10.5 K/uL Final  . RBC 03/12/2016 3.93  3.87 - 5.11 MIL/uL Final  . Hemoglobin 03/12/2016 11.1* 12.0 - 15.0 g/dL Final  . HCT 03/12/2016 33.7* 36.0 - 46.0 % Final  . MCV 03/12/2016 85.8  78.0 - 100.0 fL Final  . MCH 03/12/2016 28.2  26.0 - 34.0 pg Final  . MCHC 03/12/2016 32.9  30.0 - 36.0 g/dL Final  . RDW 03/12/2016 14.3  11.5 - 15.5 % Final  . Platelets 03/12/2016 344  150 - 400 K/uL Final  . Sodium  03/12/2016 139  135 - 145 mmol/L Final  . Potassium 03/12/2016 4.2  3.5 - 5.1 mmol/L Final  . Chloride 03/12/2016 105  101 - 111 mmol/L Final  . CO2 03/12/2016 25  22 - 32 mmol/L Final  . Glucose, Bld 03/12/2016 211* 65 - 99 mg/dL Final  . BUN 03/12/2016 15  6 - 20 mg/dL Final  . Creatinine, Ser 03/12/2016 0.99  0.44 - 1.00 mg/dL Final  . Calcium 03/12/2016 8.7* 8.9 - 10.3 mg/dL Final  . GFR calc non Af Amer 03/12/2016 >60  >60 mL/min Final  . GFR calc Af Amer 03/12/2016 >60  >60 mL/min Final   Comment: (NOTE) The eGFR has been calculated using the CKD EPI equation. This calculation has not been validated in all clinical situations. eGFR's persistently <60 mL/min signify possible Chronic Kidney Disease.   Georgiann Hahn gap 03/12/2016 9  5 - 15 Final  Hospital Outpatient Visit on 03/04/2016  Component Date Value Ref Range Status  . MRSA, PCR 03/04/2016 NEGATIVE  NEGATIVE Final  . Staphylococcus aureus 03/04/2016 POSITIVE* NEGATIVE Final   Comment:        The Xpert SA Assay (FDA approved for NASAL specimens in patients over 8 years of age), is one component of a comprehensive surveillance program.  Test performance has been validated by [facility] for patients greater than or equal to 45 year old. It is not intended to diagnose infection nor to guide or monitor treatment.   Marland Kitchen  aPTT 03/04/2016 34  24 - 37 seconds Final  . WBC 03/04/2016 7.9  4.0 - 10.5 K/uL Final  . RBC 03/04/2016 4.27  3.87 - 5.11 MIL/uL Final  . Hemoglobin 03/04/2016 12.0  12.0 - 15.0 g/dL Final  . HCT 03/04/2016 36.9  36.0 - 46.0 % Final  . MCV 03/04/2016 86.4  78.0 - 100.0 fL Final  . MCH 03/04/2016 28.1  26.0 - 34.0 pg Final  . MCHC 03/04/2016 32.5  30.0 - 36.0 g/dL Final  . RDW 03/04/2016 14.7  11.5 - 15.5 % Final  . Platelets 03/04/2016 323  150 - 400 K/uL Final  . Sodium 03/04/2016 142  135 - 145 mmol/L Final  . Potassium 03/04/2016 4.0  3.5 - 5.1 mmol/L Final  . Chloride 03/04/2016 108  101 - 111 mmol/L  Final  . CO2 03/04/2016 25  22 - 32 mmol/L Final  . Glucose, Bld 03/04/2016 162* 65 - 99 mg/dL Final  . BUN 03/04/2016 18  6 - 20 mg/dL Final  . Creatinine, Ser 03/04/2016 0.97  0.44 - 1.00 mg/dL Final  . Calcium 03/04/2016 9.0  8.9 - 10.3 mg/dL Final  . Total Protein 03/04/2016 7.2  6.5 - 8.1 g/dL Final  . Albumin 03/04/2016 3.8  3.5 - 5.0 g/dL Final  . AST 03/04/2016 23  15 - 41 U/L Final  . ALT 03/04/2016 21  14 - 54 U/L Final  . Alkaline Phosphatase 03/04/2016 96  38 - 126 U/L Final  . Total Bilirubin 03/04/2016 0.4  0.3 - 1.2 mg/dL Final  . GFR calc non Af Amer 03/04/2016 >60  >60 mL/min Final  . GFR calc Af Amer 03/04/2016 >60  >60 mL/min Final   Comment: (NOTE) The eGFR has been calculated using the CKD EPI equation. This calculation has not been validated in all clinical situations. eGFR's persistently <60 mL/min signify possible Chronic Kidney Disease.   . Anion gap 03/04/2016 9  5 - 15 Final  . Prothrombin Time 03/04/2016 14.7  11.6 - 15.2 seconds Final  . INR 03/04/2016 1.18  0.00 - 1.49 Final  . ABO/RH(D) 03/04/2016 O POS   Final  . Antibody Screen 03/04/2016 NEG   Final  . Sample Expiration 03/04/2016 03/14/2016   Final  . Extend sample reason 03/04/2016 NO TRANSFUSIONS OR PREGNANCY IN THE PAST 3 MONTHS   Final  . Color, Urine 03/04/2016 AMBER* YELLOW Final   BIOCHEMICALS MAY BE AFFECTED BY COLOR  . APPearance 03/04/2016 TURBID* CLEAR Final  . Specific Gravity, Urine 03/04/2016 1.025  1.005 - 1.030 Final  . pH 03/04/2016 5.5  5.0 - 8.0 Final  . Glucose, UA 03/04/2016 NEGATIVE  NEGATIVE mg/dL Final  . Hgb urine dipstick 03/04/2016 NEGATIVE  NEGATIVE Final  . Bilirubin Urine 03/04/2016 NEGATIVE  NEGATIVE Final  . Ketones, ur 03/04/2016 NEGATIVE  NEGATIVE mg/dL Final  . Protein, ur 03/04/2016 NEGATIVE  NEGATIVE mg/dL Final  . Nitrite 03/04/2016 NEGATIVE  NEGATIVE Final  . Leukocytes, UA 03/04/2016 NEGATIVE  NEGATIVE Final  . Preg, Serum 03/04/2016 NEGATIVE  NEGATIVE  Final   Comment:        THE SENSITIVITY OF THIS METHODOLOGY IS >10 mIU/mL.   Marland Kitchen Squamous Epithelial / LPF 03/04/2016 0-5* NONE SEEN Final  . WBC, UA 03/04/2016 NONE SEEN  0 - 5 WBC/hpf Final  . RBC / HPF 03/04/2016 NONE SEEN  0 - 5 RBC/hpf Final  . Bacteria, UA 03/04/2016 NONE SEEN  NONE SEEN Final  . Urine-Other 03/04/2016 AMORPHOUS URATES/PHOSPHATES  Final     X-Rays:No results found.  EKG: Orders placed or performed during the hospital encounter of 12/15/15  . EKG 12-Lead  . EKG 12-Lead     Hospital Course: Rachael Shelton is a 55 y.o. who was admitted to [facility]. They were brought to the operating room on 03/11/2016 and underwent Procedure(s): LEFT TOTAL KNEE ARTHROPLASTY.  Patient tolerated the procedure well and was later transferred to the recovery room and then to the orthopaedic floor for postoperative care.  They were given PO and IV analgesics for pain control following their surgery.  They were given 24 hours of postoperative antibiotics of  Anti-infectives    Start     Dose/Rate Route Frequency Ordered Stop   03/11/16 1500  ceFAZolin (ANCEF) IVPB 2g/100 mL premix     2 g 200 mL/hr over 30 Minutes Intravenous Every 6 hours 03/11/16 1219 03/11/16 2103   03/11/16 0630  ceFAZolin (ANCEF) IVPB 2g/100 mL premix     2 g 200 mL/hr over 30 Minutes Intravenous On call to O.R. 03/11/16 0630 03/11/16 0911     and started on DVT prophylaxis in the form of Xarelto.   PT and OT were ordered for total joint protocol.  Discharge planning consulted to help with postop disposition and equipment needs.  Patient had a decent night on the evening of surgery.  They started to get up OOB with therapy on day one. Hemovac drain was pulled without difficulty.  Continued to work with therapy into day two.  Dressing was changed on day two and the incision was healing well.  Patient was seen in rounds on POD 2 and was ready to go home.  Discharge home with home health Diet - Regular  diet Follow up - in 2 weeks Activity - WBAT Disposition - Home Condition Upon Discharge - Good D/C Meds - See DC Summary DVT Prophylaxis - Xarelto  Discharge Instructions    Call MD / Call 911    Complete by:  As directed   If you experience chest pain or shortness of breath, CALL 911 and be transported to the hospital emergency room.  If you develope a fever above 101 F, pus (white drainage) or increased drainage or redness at the wound, or calf pain, call your surgeon's office.     Change dressing    Complete by:  As directed   Change dressing daily with sterile 4 x 4 inch gauze dressing and apply TED hose. Do not submerge the incision under water.     Constipation Prevention    Complete by:  As directed   Drink plenty of fluids.  Prune juice may be helpful.  You may use a stool softener, such as Colace (over the counter) 100 mg twice a day.  Use MiraLax (over the counter) for constipation as needed.     Diet - low sodium heart healthy    Complete by:  As directed      Discharge instructions    Complete by:  As directed   Pick up stool softner and laxative for home use following surgery while on pain medications. Do not submerge incision under water. Please use good hand washing techniques while changing dressing each day. May shower starting three days after surgery. Please use a clean towel to pat the incision dry following showers. Continue to use ice for pain and swelling after surgery. Do not use any lotions or creams on the incision until instructed by your surgeon.  Take  Xarelto for two and a half more weeks, then discontinue Xarelto. Once the patient has completed the blood thinner regimen, then take a Baby 81 mg Aspirin daily for three more weeks.  Postoperative Constipation Protocol  Constipation - defined medically as fewer than three stools per week and severe constipation as less than one stool per week.  One of the most common issues patients have following surgery  is constipation.  Even if you have a regular bowel pattern at home, your normal regimen is likely to be disrupted due to multiple reasons following surgery.  Combination of anesthesia, postoperative narcotics, change in appetite and fluid intake all can affect your bowels.  In order to avoid complications following surgery, here are some recommendations in order to help you during your recovery period.  Colace (docusate) - Pick up an over-the-counter form of Colace or another stool softener and take twice a day as long as you are requiring postoperative pain medications.  Take with a full glass of water daily.  If you experience loose stools or diarrhea, hold the colace until you stool forms back up.  If your symptoms do not get better within 1 week or if they get worse, check with your doctor.  Dulcolax (bisacodyl) - Pick up over-the-counter and take as directed by the product packaging as needed to assist with the movement of your bowels.  Take with a full glass of water.  Use this product as needed if not relieved by Colace only.   MiraLax (polyethylene glycol) - Pick up over-the-counter to have on hand.  MiraLax is a solution that will increase the amount of water in your bowels to assist with bowel movements.  Take as directed and can mix with a glass of water, juice, soda, coffee, or tea.  Take if you go more than two days without a movement. Do not use MiraLax more than once per day. Call your doctor if you are still constipated or irregular after using this medication for 7 days in a row.  If you continue to have problems with postoperative constipation, please contact the office for further assistance and recommendations.  If you experience "the worst abdominal pain ever" or develop nausea or vomiting, please contact the office immediatly for further recommendations for treatment.     Do not put a pillow under the knee. Place it under the heel.    Complete by:  As directed      Do not sit on  low chairs, stoools or toilet seats, as it may be difficult to get up from low surfaces    Complete by:  As directed      Driving restrictions    Complete by:  As directed   No driving until released by the physician.     Increase activity slowly as tolerated    Complete by:  As directed      Lifting restrictions    Complete by:  As directed   No lifting until released by the physician.     Patient may shower    Complete by:  As directed   You may shower without a dressing once there is no drainage.  Do not wash over the wound.  If drainage remains, do not shower until drainage stops.     TED hose    Complete by:  As directed   Use stockings (TED hose) for 3 weeks on both leg(s).  You may remove them at night for sleeping.     Weight bearing  as tolerated    Complete by:  As directed   Laterality:  left  Extremity:  Lower            Medication List    STOP taking these medications        cholecalciferol 1000 units tablet  Commonly known as:  VITAMIN D     diclofenac 75 MG EC tablet  Commonly known as:  VOLTAREN     Fish Oil 1000 MG Caps     multivitamin with minerals Tabs tablet     vitamin E 400 UNIT capsule      TAKE these medications        albuterol 108 (90 Base) MCG/ACT inhaler  Commonly known as:  PROVENTIL HFA;VENTOLIN HFA  Inhale 2 puffs into the lungs every 6 (six) hours as needed for wheezing or shortness of breath (environmental asthma).     albuterol (2.5 MG/3ML) 0.083% nebulizer solution  Commonly known as:  PROVENTIL  Take 2.5 mg by nebulization every 6 (six) hours as needed for wheezing or shortness of breath.     amLODipine 5 MG tablet  Commonly known as:  NORVASC  Take 5 mg by mouth daily.     cetirizine 10 MG tablet  Commonly known as:  ZYRTEC  Take 10 mg by mouth daily as needed for allergies.     DULoxetine 30 MG capsule  Commonly known as:  CYMBALTA  Take 30 mg by mouth at bedtime.     EPIPEN 2-PAK 0.3 mg/0.3 mL Soaj injection   Generic drug:  EPINEPHrine  Inject 0.3 mg into the muscle once as needed (Allergic Reaction).     Fluticasone-Salmeterol 250-50 MCG/DOSE Aepb  Commonly known as:  ADVAIR  Inhale 2 puffs into the lungs 2 (two) times daily.     levothyroxine 100 MCG tablet  Commonly known as:  SYNTHROID, LEVOTHROID  Take 100 mcg by mouth daily before breakfast.     losartan 100 MG tablet  Commonly known as:  COZAAR  Take 100 mg by mouth daily.     Magnesium 250 MG Tabs  Take 1 tablet by mouth at bedtime.     methocarbamol 500 MG tablet  Commonly known as:  ROBAXIN  Take 1 tablet (500 mg total) by mouth every 6 (six) hours as needed for muscle spasms.     omeprazole 20 MG capsule  Commonly known as:  PRILOSEC  Take 20 mg by mouth daily.     oxyCODONE 5 MG immediate release tablet  Commonly known as:  Oxy IR/ROXICODONE  Take 1-2 tablets (5-10 mg total) by mouth every 3 (three) hours as needed for moderate pain or severe pain.     rivaroxaban 10 MG Tabs tablet  Commonly known as:  XARELTO  Take 1 tablet (10 mg total) by mouth daily with breakfast. Take Xarelto for two and a half more weeks, then discontinue Xarelto. Once the patient has completed the blood thinner regimen, then take a Baby 81 mg Aspirin daily for three more weeks.     SYSTANE 0.4-0.3 % Gel ophthalmic gel  Generic drug:  Polyethyl Glycol-Propyl Glycol  Place 1 application into both eyes daily as needed (Dry eyes).           Follow-up Information    Follow up with [facility]   Why:  Camelyn has been requested as your physical therapist   Contact information:   [address] [place] [address] [phone]  Follow up with Gearlean Alf, MD. Schedule an appointment as soon as possible for a visit on 03/26/2016.   Specialty:  Orthopedic Surgery   Why:  Call office at [phone] to setup appointment on Tuesday 03/26/2016 with Dr. Wynelle Link.   Contact information:   [address] [place] [address] [phone]       Signed: Arlee Muslim, PA-C Orthopaedic Surgery 03/12/2016, 10:08 PM

## 2016-03-12 NOTE — Progress Notes (Signed)
   Subjective: 1 Day Post-Op Procedure(s) (LRB): LEFT TOTAL KNEE ARTHROPLASTY (Left) Patient reports pain as mild.   Patient seen in rounds with Dr. Lequita HaltAluisio. Patient is well, and has had no acute complaints or problems We will start therapy today.  Plan is to go Home after hospital stay.  Objective: Vital signs in last 24 hours: Temp:  [97.8 F (36.6 C)-98.3 F (36.8 C)] 98.2 F (36.8 C) (04/04 0605) Pulse Rate:  [86-113] 98 (04/04 0605) Resp:  [15-20] 16 (04/04 0605) BP: (138-163)/(69-100) 148/81 mmHg (04/04 0605) SpO2:  [91 %-100 %] 97 % (04/04 0605)  Intake/Output from previous day:  Intake/Output Summary (Last 24 hours) at 03/12/16 0901 Last data filed at 03/12/16 0901  Gross per 24 hour  Intake 3463.75 ml  Output   3405 ml  Net  58.75 ml    Intake/Output this shift: Total I/O In: 240 [P.O.:240] Out: -   Labs:  Recent Labs  03/12/16 0401  HGB 11.1*    Recent Labs  03/12/16 0401  WBC 15.9*  RBC 3.93  HCT 33.7*  PLT 344    Recent Labs  03/12/16 0401  NA 139  K 4.2  CL 105  CO2 25  BUN 15  CREATININE 0.99  GLUCOSE 211*  CALCIUM 8.7*   No results for input(s): LABPT, INR in the last 72 hours.  EXAM General - Patient is Alert, Appropriate and Oriented Extremity - Neurovascular intact Sensation intact distally Dorsiflexion/Plantar flexion intact Dressing - dressing C/D/I Motor Function - intact, moving foot and toes well on exam.  Hemovac pulled without difficulty.  Past Medical History  Diagnosis Date  . Hypothyroidism   . Asthma     environmental elements-seasonal alleries/ asthma  . Hypertension   . Bilateral dry eyes     tx. OTC  med used  . Arthritis     osteoarthritis- knees, ankylosing spondylosis back    Assessment/Plan: 1 Day Post-Op Procedure(s) (LRB): LEFT TOTAL KNEE ARTHROPLASTY (Left) Active Problems:   OA (osteoarthritis) of knee  Estimated body mass index is 40.04 kg/(m^2) as calculated from the following:  Height as of this encounter: 5' 1.5" (1.562 m).   Weight as of this encounter: 97.693 kg (215 lb 6 oz). Up with therapy Plan for discharge tomorrow Discharge home with home health  DVT Prophylaxis - Xarelto Weight-Bearing as tolerated to left leg D/C O2 and Pulse OX and try on Room Air  Avel Peacerew Perkins, PA-C Orthopaedic Surgery 03/12/2016, 9:01 AM

## 2016-03-12 NOTE — Discharge Instructions (Signed)
° °Dr. Frank Aluisio °Total Joint Specialist °[facility] °[address] °[place], [place] [address] °[phone] ° °TOTAL KNEE REPLACEMENT POSTOPERATIVE DIRECTIONS ° °Knee Rehabilitation, Guidelines Following Surgery  °Results after knee surgery are often greatly improved when you follow the exercise, range of motion and muscle strengthening exercises prescribed by your doctor. Safety measures are also important to protect the knee from further injury. Any time any of these exercises cause you to have increased pain or swelling in your knee joint, decrease the amount until you are comfortable again and slowly increase them. If you have problems or questions, call your caregiver or physical therapist for advice.  ° °HOME CARE INSTRUCTIONS  °Remove items at home which could result in a fall. This includes throw rugs or furniture in walking pathways.  °· ICE to the affected knee every three hours for 30 minutes at a time and then as needed for pain and swelling.  Continue to use ice on the knee for pain and swelling from surgery. You may notice swelling that will progress down to the foot and ankle.  This is normal after surgery.  Elevate the leg when you are not up walking on it.   °· Continue to use the breathing machine which will help keep your temperature down.  It is common for your temperature to cycle up and down following surgery, especially at night when you are not up moving around and exerting yourself.  The breathing machine keeps your lungs expanded and your temperature down. °· Do not place pillow under knee, focus on keeping the knee straight while resting ° °DIET °You may resume your previous home diet once your are discharged from the hospital. ° °DRESSING / WOUND CARE / SHOWERING °You may shower 3 days after surgery, but keep the wounds dry during showering.  You may use an occlusive plastic wrap (Press'n Seal for example), NO SOAKING/SUBMERGING IN THE BATHTUB.  If the  bandage gets wet, change with a clean dry gauze.  If the incision gets wet, pat the wound dry with a clean towel. °You may start showering once you are discharged home but do not submerge the incision under water. Just pat the incision dry and apply a dry gauze dressing on daily. °Change the surgical dressing daily and reapply a dry dressing each time. ° °ACTIVITY °Walk with your walker as instructed. °Use walker as long as suggested by your caregivers. °Avoid periods of inactivity such as sitting longer than an hour when not asleep. This helps prevent blood clots.  °You may resume a sexual relationship in one month or when given the OK by your doctor.  °You may return to work once you are cleared by your doctor.  °Do not drive a car for 6 weeks or until released by you surgeon.  °Do not drive while taking narcotics. ° °WEIGHT BEARING °Weight bearing as tolerated with assist device (walker, cane, etc) as directed, use it as long as suggested by your surgeon or therapist, typically at least 4-6 weeks. ° °POSTOPERATIVE CONSTIPATION PROTOCOL °Constipation - defined medically as fewer than three stools per week and severe constipation as less than one stool per week. ° °One of the most common issues patients have following surgery is constipation.  Even if you have a regular bowel pattern at home, your normal regimen is likely to be disrupted due to multiple reasons following surgery.  Combination of anesthesia, postoperative narcotics, change in appetite and fluid intake all can affect your bowels.    In order to avoid complications following surgery, here are some recommendations in order to help you during your recovery period. ° °Colace (docusate) - Pick up an over-the-counter form of Colace or another stool softener and take twice a day as long as you are requiring postoperative pain medications.  Take with a full glass of water daily.  If you experience loose stools or diarrhea, hold the colace until you stool forms  back up.  If your symptoms do not get better within 1 week or if they get worse, check with your doctor. ° °Dulcolax (bisacodyl) - Pick up over-the-counter and take as directed by the product packaging as needed to assist with the movement of your bowels.  Take with a full glass of water.  Use this product as needed if not relieved by Colace only.  ° °MiraLax (polyethylene glycol) - Pick up over-the-counter to have on hand.  MiraLax is a solution that will increase the amount of water in your bowels to assist with bowel movements.  Take as directed and can mix with a glass of water, juice, soda, coffee, or tea.  Take if you go more than two days without a movement. °Do not use MiraLax more than once per day. Call your doctor if you are still constipated or irregular after using this medication for 7 days in a row. ° °If you continue to have problems with postoperative constipation, please contact the office for further assistance and recommendations.  If you experience "the worst abdominal pain ever" or develop nausea or vomiting, please contact the office immediatly for further recommendations for treatment. ° °ITCHING ° If you experience itching with your medications, try taking only a single pain pill, or even half a pain pill at a time.  You can also use Benadryl over the counter for itching or also to help with sleep.  ° °TED HOSE STOCKINGS °Wear the elastic stockings on both legs for three weeks following surgery during the day but you may remove then at night for sleeping. ° °MEDICATIONS °See your medication summary on the “After Visit Summary” that the nursing staff will review with you prior to discharge.  You may have some home medications which will be placed on hold until you complete the course of blood thinner medication.  It is important for you to complete the blood thinner medication as prescribed by your surgeon.  Continue your approved medications as instructed at time of  discharge. ° °PRECAUTIONS °If you experience chest pain or shortness of breath - call 911 immediately for transfer to the hospital emergency department.  °If you develop a fever greater that 101 F, purulent drainage from wound, increased redness or drainage from wound, foul odor from the wound/dressing, or calf pain - CONTACT YOUR SURGEON.   °                                                °FOLLOW-UP APPOINTMENTS °Make sure you keep all of your appointments after your operation with your surgeon and caregivers. You should call the office at the above phone number and make an appointment for approximately two weeks after the date of your surgery or on the date instructed by your surgeon outlined in the "After Visit Summary". ° ° °RANGE OF MOTION AND STRENGTHENING EXERCISES  °Rehabilitation of the knee is important following a knee injury or   an operation. After just a few days of immobilization, the muscles of the thigh which control the knee become weakened and shrink (atrophy). Knee exercises are designed to build up the tone and strength of the thigh muscles and to improve knee motion. Often times heat used for twenty to thirty minutes before working out will loosen up your tissues and help with improving the range of motion but do not use heat for the first two weeks following surgery. These exercises can be done on a training (exercise) mat, on the floor, on a table or on a bed. Use what ever works the best and is most comfortable for you Knee exercises include:  °Leg Lifts - While your knee is still immobilized in a splint or cast, you can do straight leg raises. Lift the leg to 60 degrees, hold for 3 sec, and slowly lower the leg. Repeat 10-20 times 2-3 times daily. Perform this exercise against resistance later as your knee gets better.  °Quad and Hamstring Sets - Tighten up the muscle on the front of the thigh (Quad) and hold for 5-10 sec. Repeat this 10-20 times hourly. Hamstring sets are done by pushing the  foot backward against an object and holding for 5-10 sec. Repeat as with quad sets.  °· Leg Slides: Lying on your back, slowly slide your foot toward your buttocks, bending your knee up off the floor (only go as far as is comfortable). Then slowly slide your foot back down until your leg is flat on the floor again. °· Angel Wings: Lying on your back spread your legs to the side as far apart as you can without causing discomfort.  °A rehabilitation program following serious knee injuries can speed recovery and prevent re-injury in the future due to weakened muscles. Contact your doctor or a physical therapist for more information on knee rehabilitation.  ° °IF YOU ARE TRANSFERRED TO A SKILLED REHAB FACILITY °If the patient is transferred to a skilled rehab facility following release from the hospital, a list of the current medications will be sent to the facility for the patient to continue.  When discharged from the skilled rehab facility, please have the facility set up the patient's Home Health Physical Therapy prior to being released. Also, the skilled facility will be responsible for providing the patient with their medications at time of release from the facility to include their pain medication, the muscle relaxants, and their blood thinner medication. If the patient is still at the rehab facility at time of the two week follow up appointment, the skilled rehab facility will also need to assist the patient in arranging follow up appointment in our office and any transportation needs. ° °MAKE SURE YOU:  °Understand these instructions.  °Get help right away if you are not doing well or get worse.  ° ° °Pick up stool softner and laxative for home use following surgery while on pain medications. °Do not submerge incision under water. °Please use good hand washing techniques while changing dressing each day. °May shower starting three days after surgery. °Please use a clean towel to pat the incision dry following  showers. °Continue to use ice for pain and swelling after surgery. °Do not use any lotions or creams on the incision until instructed by your surgeon. ° °Take Xarelto for two and a half more weeks, then discontinue Xarelto. °Once the patient has completed the blood thinner regimen, then take a Baby 81 mg Aspirin daily for three more weeks. ° °

## 2016-03-12 NOTE — Care Management Note (Signed)
Case Management Note  Patient Details  Name: Rachael Shelton MRN: [id number] Date of Birth: 10/25/1961  Subjective/Objective:                  LEFT TOTAL KNEE ARTHROPLASTY (Left)  Action/Plan: Discharge planning Expected Discharge Date:  03/13/16               Expected Discharge Plan:  [place]  In-House Referral:     Discharge planning Services  CM Consult  Post Acute Care Choice:  Home Health Choice offered to:  Patient  DME Arranged:  N/A DME Agency:  NA  HH Arranged:  PT HH Agency:  [facility]  Status of Service:  Completed, signed off  Medicare Important Message Given:    Date Medicare IM Given:    Medicare IM give by:    Date Additional Medicare IM Given:    Additional Medicare Important Message give by:     If discussed at [place] of Stay Meetings, dates discussed:    Additional Comments: CM met with pt in room to offer choice of home health agency.  Pt chooses [facility] to render HHPT. Referral given to [facility] rep, Tim (on unit) with request for [facility].  Pt has all DME from previous surgery and declines need for additional DME.  No other CM needs were communicated. Dellie Catholic, RN 03/12/2016, 1:37 PM

## 2016-03-12 NOTE — Progress Notes (Signed)
Physical Therapy Treatment Patient Details Name: Rachael Shelton MRN: [id number] DOB: 07-Sep-1961 Today's Date: 03/12/2016    History of Present Illness 55 yo female s/p L TKA 03/11/16.     PT Comments    Progressing well with mobility.   Follow Up Recommendations  Home health PT     Equipment Recommendations  None recommended by PT    Recommendations for Other Services       Precautions / Restrictions Precautions Precautions: Fall Required Braces or Orthoses: Knee Immobilizer - Left Knee Immobilizer - Left: Discontinue once straight leg raise with < 10 degree lag (KI soiled this am. Pt was able to walk without KI in am/pm.) Restrictions Weight Bearing Restrictions: No LLE Weight Bearing: Weight bearing as tolerated    Mobility  Bed Mobility   Bed Mobility: Sit to Supine       Sit to supine: Modified independent (Device/Increase time)      Transfers Overall transfer level: Needs assistance Equipment used: Rolling walker (2 wheeled) Transfers: Sit to/from Stand Sit to Stand: Supervision         General transfer comment: for safety  Ambulation/Gait Ambulation/Gait assistance: Supervision Ambulation Distance (Feet): 120 Feet Assistive device: Rolling walker (2 wheeled) Gait Pattern/deviations: Step-through pattern;Antalgic     General Gait Details: for safety. No knee buckling noted.    Stairs            Wheelchair Mobility    Modified Rankin (Stroke Patients Only)       Balance                                    Cognition Arousal/Alertness: Awake/alert Behavior During Therapy: WFL for tasks assessed/performed Overall Cognitive Status: Within Functional Limits for tasks assessed                      Exercises      General Comments        Pertinent Vitals/Pain Pain Assessment: 0-10 Pain Score: 7  Pain Location: L knee Pain Descriptors / Indicators: Sore;Aching Pain Intervention(s): Monitored during  session;Ice applied;Repositioned    Home Living                      Prior Function            PT Goals (current goals can now be found in the care plan section) Progress towards PT goals: Progressing toward goals    Frequency  7X/week    PT Plan Current plan remains appropriate    Co-evaluation             End of Session   Activity Tolerance: Patient tolerated treatment well Patient left: in bed;with call bell/phone within reach     Time: 1344-1354 PT Time Calculation (min) (ACUTE ONLY): 10 min  Charges:  $Gait Training: 8-22 mins                    G Codes:      Rebeca AlertJannie Loras Grieshop, MPT Pager: [phone]

## 2016-03-13 LAB — CBC
HEMATOCRIT: 33.5 % — AB (ref 36.0–46.0)
HEMOGLOBIN: 10.8 g/dL — AB (ref 12.0–15.0)
MCH: 27.1 pg (ref 26.0–34.0)
MCHC: 32.2 g/dL (ref 30.0–36.0)
MCV: 84.2 fL (ref 78.0–100.0)
Platelets: 394 10*3/uL (ref 150–400)
RBC: 3.98 MIL/uL (ref 3.87–5.11)
RDW: 14.5 % (ref 11.5–15.5)
WBC: 20.1 10*3/uL — ABNORMAL HIGH (ref 4.0–10.5)

## 2016-03-13 LAB — BASIC METABOLIC PANEL
Anion gap: 10 (ref 5–15)
BUN: 16 mg/dL (ref 6–20)
CHLORIDE: 106 mmol/L (ref 101–111)
CO2: 26 mmol/L (ref 22–32)
CREATININE: 0.89 mg/dL (ref 0.44–1.00)
Calcium: 9.1 mg/dL (ref 8.9–10.3)
GFR calc Af Amer: >60 mL/min (ref >60)
GFR calc non Af Amer: >60 mL/min (ref >60)
Glucose, Bld: 153 mg/dL — ABNORMAL HIGH (ref 65–99)
Potassium: 4.4 mmol/L (ref 3.5–5.1)
SODIUM: 142 mmol/L (ref 135–145)

## 2016-03-13 NOTE — Progress Notes (Signed)
Physical Therapy Treatment Patient Details Name: Rachael ChangJudy A Cunnington MRN: [id number] DOB: [birth year] Today's Date: 03/13/2016    History of Present Illness 55 yo female s/p L TKA 03/11/16.     PT Comments    POD # 2 Assisted OOB to amb to bathroom then amb in hallway.  Pt very knowledgeable having had TKR prior.  Performed all supine TKR TE's following HEP handout.  Instructed on proper tech and freq followed by ICE.  Follow Up Recommendations  Home health PT     Equipment Recommendations  None recommended by PT (has everything from prior TKR)    Recommendations for Other Services       Precautions / Restrictions Precautions Precautions: Fall Precaution Comments: able tp perform SLR so D/C KI Restrictions Weight Bearing Restrictions: No LLE Weight Bearing: Weight bearing as tolerated    Mobility  Bed Mobility Overal bed mobility: Modified Independent Bed Mobility: Supine to Sit       Sit to supine: HOB elevated   General bed mobility comments: pt able to self perform  Transfers Overall transfer level: Needs assistance Equipment used: Rolling walker (2 wheeled) Transfers: Sit to/from Stand Sit to Stand: Supervision         General transfer comment: good safet6y cognition and use of hands to steady self  Ambulation/Gait Ambulation/Gait assistance: Supervision Ambulation Distance (Feet): 125 Feet Assistive device: Rolling walker (2 wheeled) Gait Pattern/deviations: Step-through pattern;Decreased stride length Gait velocity: decreased   General Gait Details: one VC safety with turns and backward gait   Stairs            Wheelchair Mobility    Modified Rankin (Stroke Patients Only)       Balance                                    Cognition Arousal/Alertness: Awake/alert Behavior During Therapy: WFL for tasks assessed/performed Overall Cognitive Status: Within Functional Limits for tasks assessed                       Exercises   Total Knee Replacement TE's 10 reps B LE ankle pumps 10 reps towel squeezes 10 reps knee presses 10 reps heel slides  10 reps SAQ's 10 reps SLR's 10 reps ABD Followed by ICE     General Comments        Pertinent Vitals/Pain Pain Assessment: 0-10 Pain Score: 3  Pain Location: L knee Pain Descriptors / Indicators: Tender;Sore Pain Intervention(s): Monitored during session;Premedicated before session;Repositioned;Ice applied    Home Living                      Prior Function            PT Goals (current goals can now be found in the care plan section) Progress towards PT goals: Progressing toward goals    Frequency  7X/week    PT Plan Current plan remains appropriate    Co-evaluation             End of Session Equipment Utilized During Treatment: Gait belt Activity Tolerance: Patient tolerated treatment well Patient left: in chair;with call bell/phone within reach     Time: 0915-0945 PT Time Calculation (min) (ACUTE ONLY): 30 min  Charges:  $Gait Training: 8-22 mins $Therapeutic Exercise: 8-22 mins  G Codes:      Rica Koyanagi  PTA [facility]  Acute  Rehab Pager      [phone]

## 2016-03-13 NOTE — Progress Notes (Signed)
RN reviewed discharge instructions with patient and family. All questions answered.   Paperwork and prescriptions given.   NT rolled patient in wheelchair to family car with all belongings.

## 2016-03-13 NOTE — Progress Notes (Signed)
   Subjective: 2 Days Post-Op Procedure(s) (LRB): LEFT TOTAL KNEE ARTHROPLASTY (Left) Patient reports pain as mild.   Patient seen in rounds with Dr. Lequita HaltAluisio. Patient is well, but has had some minor complaints of pain in the knee, requiring pain medications Patient is ready to go home today  Objective: Vital signs in last 24 hours: Temp:  [98 F (36.7 C)-98.2 F (36.8 C)] 98 F (36.7 C) (04/05 0540) Pulse Rate:  [103-113] 103 (04/05 0540) Resp:  [16] 16 (04/05 0540) BP: (136-159)/(69-81) 136/79 mmHg (04/05 0540) SpO2:  [95 %-97 %] 96 % (04/05 0540)  Intake/Output from previous day:  Intake/Output Summary (Last 24 hours) at 03/13/16 0724 Last data filed at 03/12/16 1858  Gross per 24 hour  Intake  937.5 ml  Output    950 ml  Net  -12.5 ml    Labs:  Recent Labs  03/12/16 0401 03/13/16 0344  HGB 11.1* 10.8*    Recent Labs  03/12/16 0401 03/13/16 0344  WBC 15.9* 20.1*  RBC 3.93 3.98  HCT 33.7* 33.5*  PLT 344 394    Recent Labs  03/12/16 0401 03/13/16 0344  NA 139 142  K 4.2 4.4  CL 105 106  CO2 25 26  BUN 15 16  CREATININE 0.99 0.89  GLUCOSE 211* 153*  CALCIUM 8.7* 9.1   No results for input(s): LABPT, INR in the last 72 hours.  EXAM: General - Patient is Alert, Appropriate and Oriented Extremity - Neurovascular intact Sensation intact distally Dorsiflexion/Plantar flexion intact Incision - clean, dry, no drainage Motor Function - intact, moving foot and toes well on exam.   Assessment/Plan: 2 Days Post-Op Procedure(s) (LRB): LEFT TOTAL KNEE ARTHROPLASTY (Left) Procedure(s) (LRB): LEFT TOTAL KNEE ARTHROPLASTY (Left) Past Medical History  Diagnosis Date  . Hypothyroidism   . Asthma     environmental elements-seasonal alleries/ asthma  . Hypertension   . Bilateral dry eyes     tx. OTC  med used  . Arthritis     osteoarthritis- knees, ankylosing spondylosis back   Active Problems:   OA (osteoarthritis) of knee  Estimated body mass  index is 40.04 kg/(m^2) as calculated from the following:   Height as of this encounter: 5' 1.5" (1.562 m).   Weight as of this encounter: 97.693 kg (215 lb 6 oz). Up with therapy Discharge home with home health Diet - Regular diet Follow up - in 2 weeks Activity - WBAT Disposition - Home Condition Upon Discharge - Good D/C Meds - See DC Summary DVT Prophylaxis - Xarelto  Avel Peacerew Amberli Ruegg, PA-C Orthopaedic Surgery 03/13/2016, 7:24 AM

## 2016-03-20 ENCOUNTER — Other Ambulatory Visit: Payer: Self-pay | Admitting: Orthopedic Surgery

## 2016-03-20 ENCOUNTER — Ambulatory Visit (HOSPITAL_COMMUNITY)
Admission: RE | Admit: 2016-03-20 | Discharge: 2016-03-20 | Disposition: A | Discharge status: Home / Self Care | Payer: 59 | Source: Ambulatory Visit | Attending: Cardiology | Admitting: Cardiology

## 2016-03-20 DIAGNOSIS — M7989 Other specified soft tissue disorders: Secondary | ICD-10-CM | POA: Insufficient documentation

## 2016-03-20 DIAGNOSIS — M79605 Pain in left leg: Secondary | ICD-10-CM | POA: Diagnosis not present

## 2016-03-20 DIAGNOSIS — I10 Essential (primary) hypertension: Secondary | ICD-10-CM | POA: Diagnosis not present

## 2016-03-20 DIAGNOSIS — M79662 Pain in left lower leg: Secondary | ICD-10-CM

## 2016-04-04 ENCOUNTER — Other Ambulatory Visit: Payer: Self-pay

## 2016-04-04 DIAGNOSIS — Z1231 Encounter for screening mammogram for malignant neoplasm of breast: Secondary | ICD-10-CM

## 2016-04-22 ENCOUNTER — Ambulatory Visit: Payer: 59

## 2016-04-25 ENCOUNTER — Ambulatory Visit
Admission: RE | Admit: 2016-04-25 | Discharge: 2016-04-25 | Disposition: A | Discharge status: Home / Self Care | Payer: 59 | Source: Ambulatory Visit

## 2016-04-25 DIAGNOSIS — Z1231 Encounter for screening mammogram for malignant neoplasm of breast: Secondary | ICD-10-CM

## 2016-10-16 ENCOUNTER — Emergency Department (HOSPITAL_COMMUNITY): Payer: 59

## 2016-10-16 ENCOUNTER — Emergency Department (HOSPITAL_COMMUNITY)
Admission: EM | Admit: 2016-10-16 | Discharge: 2016-10-16 | Disposition: A | Discharge status: Home / Self Care | Payer: 59 | Attending: Emergency Medicine | Admitting: Emergency Medicine

## 2016-10-16 ENCOUNTER — Encounter (HOSPITAL_COMMUNITY): Payer: Self-pay | Admitting: Emergency Medicine

## 2016-10-16 DIAGNOSIS — I10 Essential (primary) hypertension: Secondary | ICD-10-CM | POA: Insufficient documentation

## 2016-10-16 DIAGNOSIS — Z7901 Long term (current) use of anticoagulants: Secondary | ICD-10-CM | POA: Insufficient documentation

## 2016-10-16 DIAGNOSIS — J45901 Unspecified asthma with (acute) exacerbation: Secondary | ICD-10-CM | POA: Insufficient documentation

## 2016-10-16 DIAGNOSIS — R0602 Shortness of breath: Secondary | ICD-10-CM | POA: Diagnosis present

## 2016-10-16 DIAGNOSIS — E039 Hypothyroidism, unspecified: Secondary | ICD-10-CM | POA: Diagnosis not present

## 2016-10-16 DIAGNOSIS — Z96653 Presence of artificial knee joint, bilateral: Secondary | ICD-10-CM | POA: Insufficient documentation

## 2016-10-16 LAB — BASIC METABOLIC PANEL
ANION GAP: 8 (ref 5–15)
BUN: 15 mg/dL (ref 6–20)
CALCIUM: 9.1 mg/dL (ref 8.9–10.3)
CO2: 25 mmol/L (ref 22–32)
Chloride: 106 mmol/L (ref 101–111)
Creatinine, Ser: 0.99 mg/dL (ref 0.44–1.00)
Glucose, Bld: 162 mg/dL — ABNORMAL HIGH (ref 65–99)
POTASSIUM: 3.5 mmol/L (ref 3.5–5.1)
Sodium: 139 mmol/L (ref 135–145)

## 2016-10-16 LAB — CBC
HEMATOCRIT: 36.9 % (ref 36.0–46.0)
HEMOGLOBIN: 12.2 g/dL (ref 12.0–15.0)
MCH: 27.7 pg (ref 26.0–34.0)
MCHC: 33.1 g/dL (ref 30.0–36.0)
MCV: 83.7 fL (ref 78.0–100.0)
Platelets: 266 10*3/uL (ref 150–400)
RBC: 4.41 MIL/uL (ref 3.87–5.11)
RDW: 14.9 % (ref 11.5–15.5)
WBC: 8.2 10*3/uL (ref 4.0–10.5)

## 2016-10-16 MED ORDER — ALBUTEROL SULFATE (2.5 MG/3ML) 0.083% IN NEBU
INHALATION_SOLUTION | RESPIRATORY_TRACT | Status: DC
Start: 2016-10-16 — End: 2016-10-17
  Filled 2016-10-16: qty 3

## 2016-10-16 MED ORDER — IPRATROPIUM BROMIDE 0.02 % IN SOLN
0.5000 mg | Freq: Once | RESPIRATORY_TRACT | Status: AC
  Administered 2016-10-16: 0.5 mg via RESPIRATORY_TRACT
  Filled 2016-10-16: qty 2.5

## 2016-10-16 MED ORDER — ALBUTEROL SULFATE (2.5 MG/3ML) 0.083% IN NEBU
5.0000 mg | INHALATION_SOLUTION | Freq: Once | RESPIRATORY_TRACT | Status: AC
  Administered 2016-10-16: 5 mg via RESPIRATORY_TRACT
  Filled 2016-10-16: qty 6

## 2016-10-16 MED ORDER — PREDNISONE 20 MG PO TABS: ORAL_TABLET | ORAL | 0 refills | Status: AC

## 2016-10-16 MED ORDER — PREDNISONE 20 MG PO TABS
60.0000 mg | ORAL_TABLET | Freq: Once | ORAL | Status: AC
  Administered 2016-10-16: 60 mg via ORAL
  Filled 2016-10-16: qty 3

## 2016-10-16 MED ORDER — ALBUTEROL SULFATE (2.5 MG/3ML) 0.083% IN NEBU
5.0000 mg | INHALATION_SOLUTION | Freq: Once | RESPIRATORY_TRACT | Status: AC
  Administered 2016-10-16: 5 mg via RESPIRATORY_TRACT

## 2016-10-16 MED ORDER — ALBUTEROL SULFATE (2.5 MG/3ML) 0.083% IN NEBU
INHALATION_SOLUTION | RESPIRATORY_TRACT | Status: AC
  Filled 2016-10-16: qty 3

## 2016-10-16 NOTE — ED Provider Notes (Signed)
[facility]-EMER[facility]ENCY DEPT Provider Note   CSN: [id number] Arrival date & time: 10/16/16  1652     History   Chief Complaint Chief Complaint  Patient presents with  . Shortness of Breath    HPI Rachael Shelton is a 55 y.o. female.  HPI  55 year old female with a history of asthma presents with an asthma exacerbation. She states that she has been having cold-like symptoms with congestion and sneezing over the last couple days. Significant other and child are both sick as well. No fevers. Today started having the cough and wheezing consistent with prior asthma. Some partial improvement with albuterol nebulizer at home. Had a similar asthma exacerbation about one month ago and has been off steroids for about 2 weeks. Received albuterol and the waiting room and states she has had some relief but is still wheezing.  Past Medical History:  Diagnosis Date  . Arthritis    osteoarthritis- knees, ankylosing spondylosis back  . Asthma    environmental elements-seasonal alleries/ asthma  . Bilateral dry eyes    tx. OTC  med used  . Hypertension   . Hypothyroidism     Patient Active Problem List   Diagnosis Date Noted  . OA (osteoarthritis) of knee 12/29/2015    Past Surgical History:  Procedure Laterality Date  . BILATERAL CARPAL TUNNEL RELEASE    . CESAREAN SECTION    . DILATION AND CURETTAGE OF UTERUS     multiple  . JOINT REPLACEMENT     RTKA 1'17  . KNEE ARTHROSCOPY Bilateral   . LASIK     '01  . NOSE SURGERY     fracture repair  . TOTAL KNEE ARTHROPLASTY Right 12/29/2015   Procedure: TOTAL KNEE ARTHROPLASTY;  Surgeon: Ollen Gross, MD;  Location: [facility];  Service: Orthopedics;  Laterality: Right;  . TOTAL KNEE ARTHROPLASTY Left 03/11/2016   Procedure: LEFT TOTAL KNEE ARTHROPLASTY;  Surgeon: Ollen Gross, MD;  Location: [facility];  Service: Orthopedics;  Laterality: Left;    OB History    No data available       Home Medications    Prior to Admission medications     Medication Sig Start Date End Date Taking? Authorizing Provider  albuterol (PROVENTIL HFA;VENTOLIN HFA) 108 (90 Base) MCG/ACT inhaler Inhale 2 puffs into the lungs every 6 (six) hours as needed for wheezing or shortness of breath (environmental asthma).   Yes Historical Provider, MD  albuterol (PROVENTIL) (2.5 MG/3ML) 0.083% nebulizer solution Take 2.5 mg by nebulization every 6 (six) hours as needed for wheezing or shortness of breath.   Yes Historical Provider, MD  amLODipine (NORVASC) 5 MG tablet Take 5 mg by mouth daily.   Yes Historical Provider, MD  atorvastatin (LIPITOR) 20 MG tablet Take 20 mg by mouth at bedtime. 08/14/16  Yes Historical Provider, MD  benzonatate (TESSALON) 200 MG capsule Take 200 mg by mouth 3 (three) times daily as needed for cough.  10/15/16  Yes Historical Provider, MD  cetirizine (ZYRTEC) 10 MG tablet Take 10 mg by mouth daily as needed for allergies.   Yes Historical Provider, MD  diclofenac (VOLTAREN) 75 MG EC tablet Take 75 mg by mouth daily. 09/30/16  Yes Historical Provider, MD  EPIPEN 2-PAK 0.3 MG/0.3ML SOAJ injection Inject 0.3 mg into the muscle once as needed (Allergic Reaction).  12/05/15  Yes Historical Provider, MD  Fluticasone-Salmeterol (ADVAIR) 250-50 MCG/DOSE AEPB Inhale 2 puffs into the lungs 2 (two) times daily.   Yes Historical Provider, MD  Fluticasone-Salmeterol (ADVAIR)  500-50 MCG/DOSE AEPB Inhale 1 puff into the lungs 2 (two) times daily.   Yes Historical Provider, MD  ipratropium (ATROVENT) 0.02 % nebulizer solution Take 0.5 mg by nebulization every 6 (six) hours as needed for wheezing or shortness of breath.  08/30/16  Yes Historical Provider, MD  levothyroxine (SYNTHROID, LEVOTHROID) 100 MCG tablet Take 100 mcg by mouth daily before breakfast.   Yes Historical Provider, MD  losartan (COZAAR) 100 MG tablet Take 100 mg by mouth daily.   Yes Historical Provider, MD  Magnesium 250 MG TABS Take 1 tablet by mouth at bedtime.   Yes Historical Provider, MD   Olopatadine HCl 0.2 % SOLN Place 1 drop into both eyes daily as needed (for allergies).  09/30/16  Yes Historical Provider, MD  omeprazole (PRILOSEC) 20 MG capsule Take 20 mg by mouth daily.   Yes Historical Provider, MD  Polyethyl Glycol-Propyl Glycol (SYSTANE) 0.4-0.3 % GEL ophthalmic gel Place 1 application into both eyes daily as needed (Dry eyes).   Yes Historical Provider, MD  methocarbamol (ROBAXIN) 500 MG tablet Take 1 tablet (500 mg total) by mouth every 6 (six) hours as needed for muscle spasms. Patient not taking: Reported on 10/16/2016 03/12/16   Avel Peacerew Perkins, PA-C  oxyCODONE (OXY IR/ROXICODONE) 5 MG immediate release tablet Take 1-2 tablets (5-10 mg total) by mouth every 3 (three) hours as needed for moderate pain or severe pain. Patient not taking: Reported on 10/16/2016 03/12/16   Avel Peacerew Perkins, PA-C  predniSONE (DELTASONE) 20 MG tablet 2 tabs po daily x 4 days 10/17/16   Pricilla LovelessScott Jennavie Martinek, MD  rivaroxaban (XARELTO) 10 MG TABS tablet Take 1 tablet (10 mg total) by mouth daily with breakfast. Take Xarelto for two and a half more weeks, then discontinue Xarelto. Once the patient has completed the blood thinner regimen, then take a Baby 81 mg Aspirin daily for three more weeks. Patient not taking: Reported on 10/16/2016 03/12/16   Avel Peacerew Perkins, PA-C    Family History Family History  Problem Relation Age of Onset  . Rheum arthritis Mother   . Emphysema Mother   . Parkinson's disease Father   . Asthma Brother   . Breast cancer Maternal Grandmother   . Breast cancer Maternal Aunt     Social History Social History  Substance Use Topics  . Smoking status: Never Smoker  . Smokeless tobacco: Never Used  . Alcohol use Yes     Comment: rare social     Allergies   Celebrex [celecoxib]; Shellfish allergy; Sulfa antibiotics; and Dilaudid [hydromorphone hcl]   Review of Systems Review of Systems  Constitutional: Negative for fever.  HENT: Positive for congestion and sneezing.    Respiratory: Positive for shortness of breath and wheezing.   Gastrointestinal: Positive for vomiting (post tussive).  All other systems reviewed and are negative.    Physical Exam Updated Vital Signs BP 158/97 (BP Location: Right Arm)   Pulse 101   Temp 98 F (36.7 C) (Oral)   Resp 16   Ht 5\' 2"  (1.575 m)   Wt 200 lb (90.7 kg)   LMP 09/24/2016   SpO2 97%   BMI 36.58 kg/m   Physical Exam  Constitutional: She is oriented to person, place, and time. She appears well-developed and well-nourished.  HENT:  Head: Normocephalic and atraumatic.  Right Ear: External ear normal.  Left Ear: External ear normal.  Nose: Nose normal.  Eyes: Right eye exhibits no discharge. Left eye exhibits no discharge.  Cardiovascular: Normal rate, regular rhythm  and normal heart sounds.   Pulmonary/Chest: Effort normal. No accessory muscle usage. No tachypnea. No respiratory distress. She has wheezes (expiratory, diffuse).  Abdominal: Soft. There is no tenderness.  Neurological: She is alert and oriented to person, place, and time.  Skin: Skin is warm and dry.  Nursing note and vitals reviewed.    ED Treatments / Results  Labs (all labs ordered are listed, but only abnormal results are displayed) Labs Reviewed  BASIC METABOLIC PANEL - Abnormal; Notable for the following:       Result Value   Glucose, Bld 162 (*)    All other components within normal limits  CBC    EKG  EKG Interpretation  Date/Time:  Wednesday October 16 2016 16:57:47 EST Ventricular Rate:  92 PR Interval:  166 QRS Duration: 94 QT Interval:  358 QTC Calculation: 442 R Axis:   16 Text Interpretation:  Normal sinus rhythm Low voltage QRS Cannot rule out Anterior infarct , age undetermined Abnormal ECG no significant change since Jan 2017 Confirmed by Criss AlvineGOLDSTON MD, Deval Mroczka [id number]) on 10/16/2016 6:42:59 PM       Radiology Dg Chest 2 View  Result Date: 10/16/2016 CLINICAL DATA:  Shortness of breath and vomiting.  Asthma attack this afternoon EXAM: CHEST  2 VIEW COMPARISON:  None. FINDINGS: Normal heart size and mediastinal contours. No acute infiltrate or edema. No effusion or pneumothorax. Thoracic degenerative disc narrowing and spurring. No acute osseous findings. IMPRESSION: No active cardiopulmonary disease. Electronically Signed   By: Marnee SpringJonathon  Watts M.D.   On: 10/16/2016 17:47    Procedures Procedures (including critical care time)  Medications Ordered in ED Medications  albuterol (PROVENTIL) (2.5 MG/3ML) 0.083% nebulizer solution (not administered)  albuterol (PROVENTIL) (2.5 MG/3ML) 0.083% nebulizer solution (not administered)  albuterol (PROVENTIL) (2.5 MG/3ML) 0.083% nebulizer solution 5 mg (5 mg Nebulization Given 10/16/16 1702)  albuterol (PROVENTIL) (2.5 MG/3ML) 0.083% nebulizer solution 5 mg (5 mg Nebulization Given 10/16/16 1939)  ipratropium (ATROVENT) nebulizer solution 0.5 mg (0.5 mg Nebulization Given 10/16/16 1939)  predniSONE (DELTASONE) tablet 60 mg (60 mg Oral Given 10/16/16 1936)     Initial Impression / Assessment and Plan / ED Course  I have reviewed the triage vital signs and the nursing notes.  Pertinent labs & imaging results that were available during my care of the patient were reviewed by me and considered in my medical decision making (see chart for details).  Clinical Course as of Oct 17 2043  Wed Oct 16, 2016  1908 Still wheezing but no resp distress, accessory muscle use, etc. Will give prednisone, redose albuterol and re-eval. Likely URI setting off asthma  [SG]  2039 Patient feels better. D/c home with steroids, q4 hr albuterol. Discussed return precautions.  [SG]    Clinical Course User Index [SG] Pricilla LovelessScott Dequavion Follette, MD    Patient's Asthma exacerbation is likely from a viral URI. No signs of pneumonia. q4 albuterol x 48 hours. Return if symptoms worsen. Doubt PNA, PE, ACS, etc  Final Clinical Impressions(s) / ED Diagnoses   Final diagnoses:  Exacerbation of  asthma, unspecified asthma severity, unspecified whether persistent    New Prescriptions New Prescriptions   PREDNISONE (DELTASONE) 20 MG TABLET    2 tabs po daily x 4 days     Pricilla LovelessScott Neyda Durango, MD 10/16/16 2045

## 2016-10-16 NOTE — ED Triage Notes (Signed)
Pt has had cough/cold for approx a week. Pt feeling increasingly SOB. Used inhaler today with no help.

## 2017-01-27 ENCOUNTER — Other Ambulatory Visit: Payer: Self-pay | Admitting: Family Medicine

## 2017-01-27 ENCOUNTER — Ambulatory Visit
Admission: RE | Admit: 2017-01-27 | Discharge: 2017-01-27 | Disposition: A | Discharge status: Home / Self Care | Payer: 59 | Source: Ambulatory Visit | Attending: Family Medicine | Admitting: Family Medicine

## 2017-01-27 DIAGNOSIS — M792 Neuralgia and neuritis, unspecified: Secondary | ICD-10-CM

## 2017-01-27 DIAGNOSIS — R52 Pain, unspecified: Secondary | ICD-10-CM

## 2017-03-24 ENCOUNTER — Other Ambulatory Visit: Payer: Self-pay | Admitting: Physician Assistant

## 2017-03-24 DIAGNOSIS — M810 Age-related osteoporosis without current pathological fracture: Secondary | ICD-10-CM

## 2017-04-02 ENCOUNTER — Other Ambulatory Visit: Payer: 59

## 2017-04-04 ENCOUNTER — Ambulatory Visit
Admission: RE | Admit: 2017-04-04 | Discharge: 2017-04-04 | Disposition: A | Discharge status: Home / Self Care | Payer: 59 | Source: Ambulatory Visit | Attending: Physician Assistant | Admitting: Physician Assistant

## 2017-04-04 DIAGNOSIS — M810 Age-related osteoporosis without current pathological fracture: Secondary | ICD-10-CM

## 2018-10-12 IMAGING — CR DG SHOULDER 2+V*L*
3 series · 3 of 3 positions shown · non-contrast
Comparison: None.

CLINICAL DATA: Posterior left neck pain radiating into left
shoulder. No known injury.

EXAM:
LEFT SHOULDER - 2+ VIEW

[w shoulder ap external left]
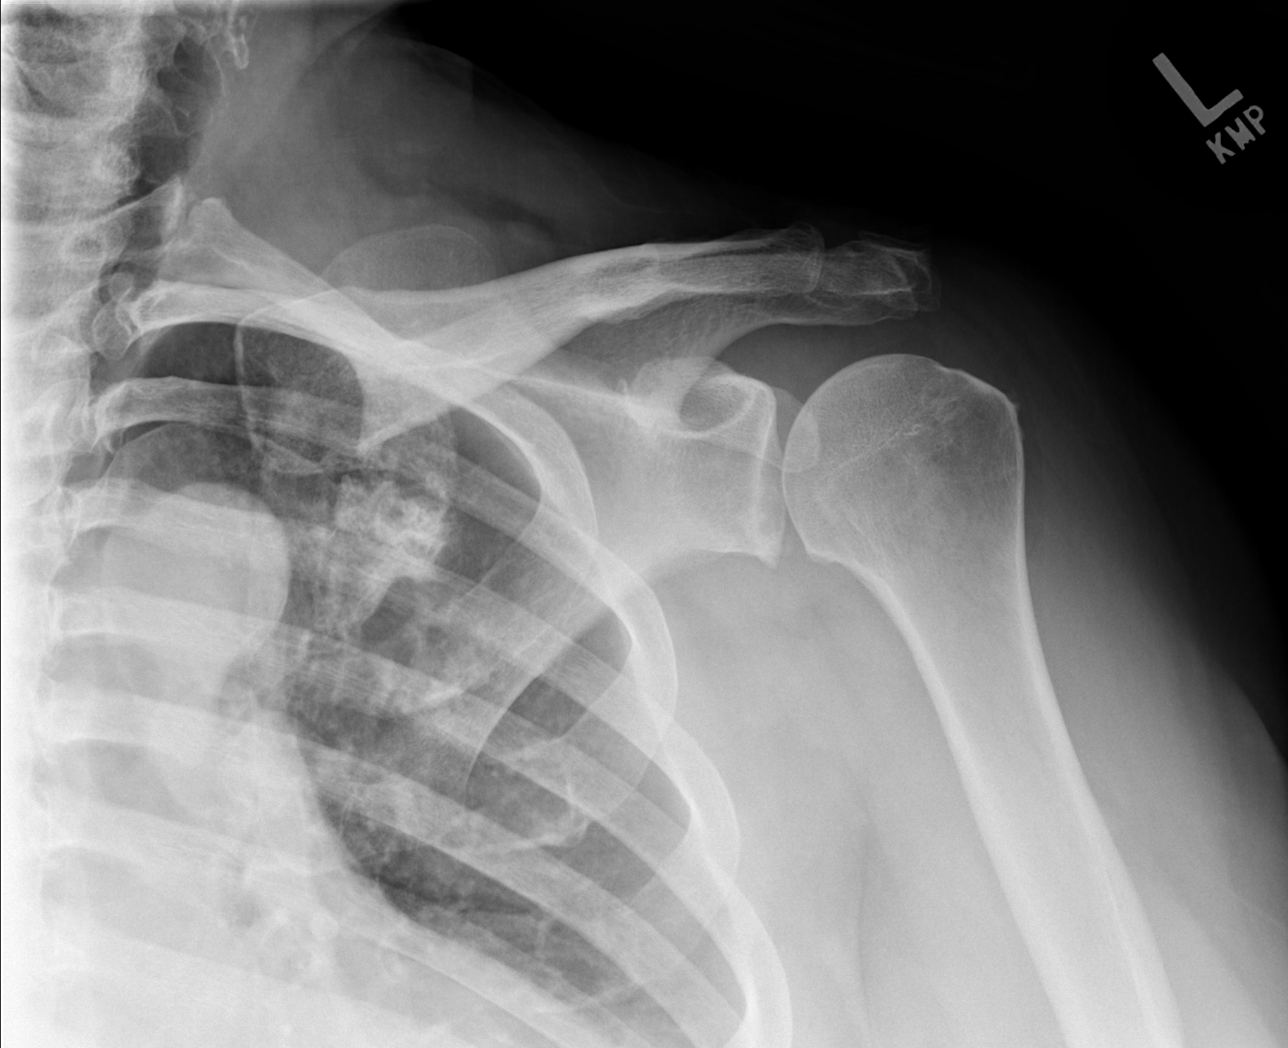

[w shoulder y view left]
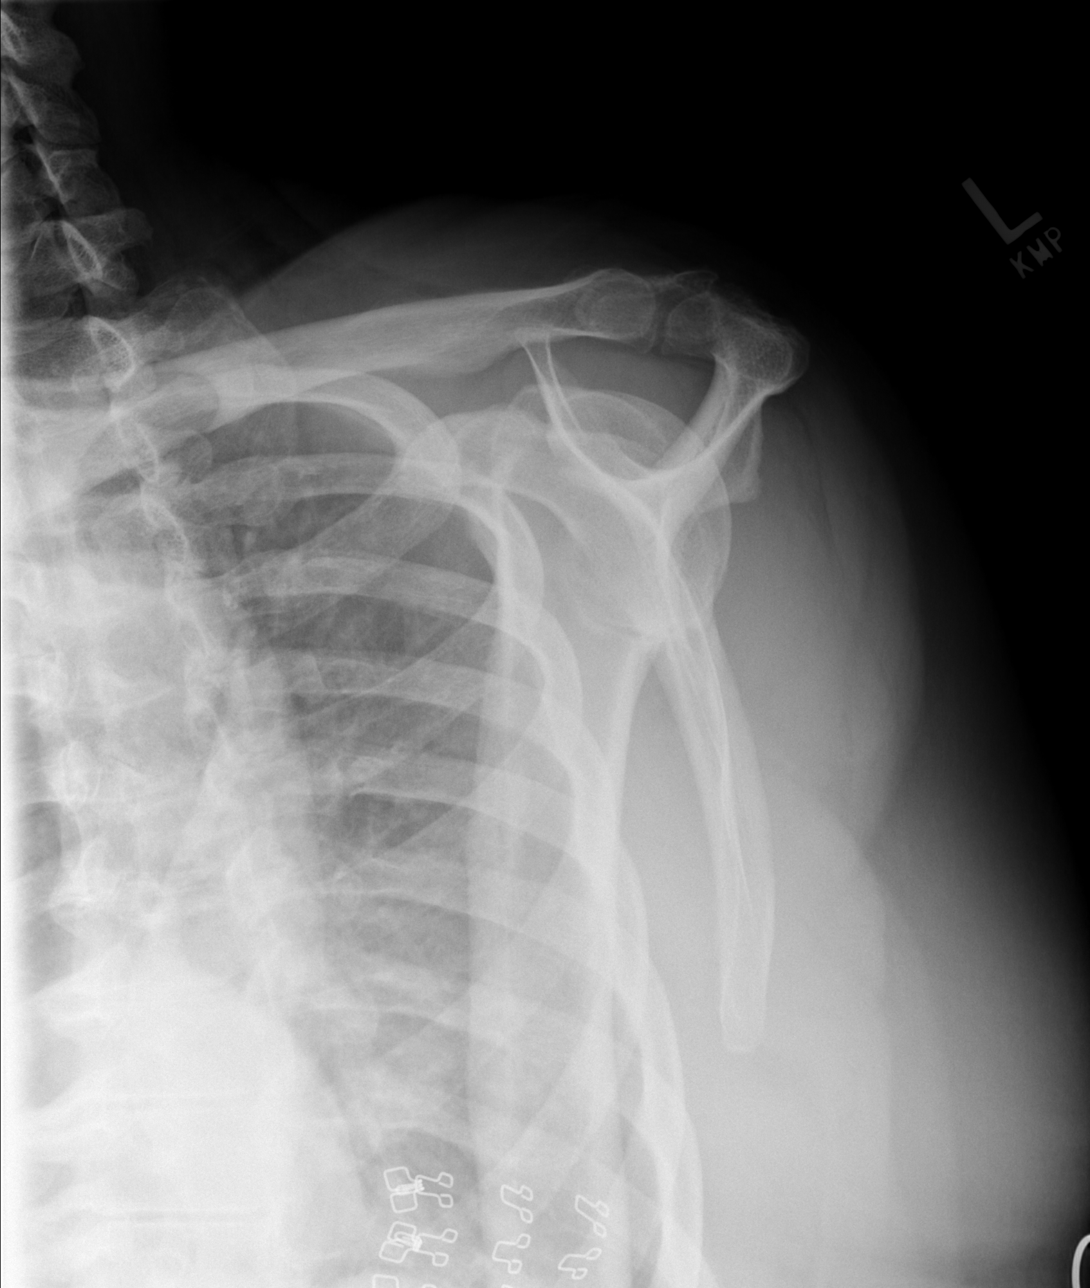

[w shoulder axillary left *]
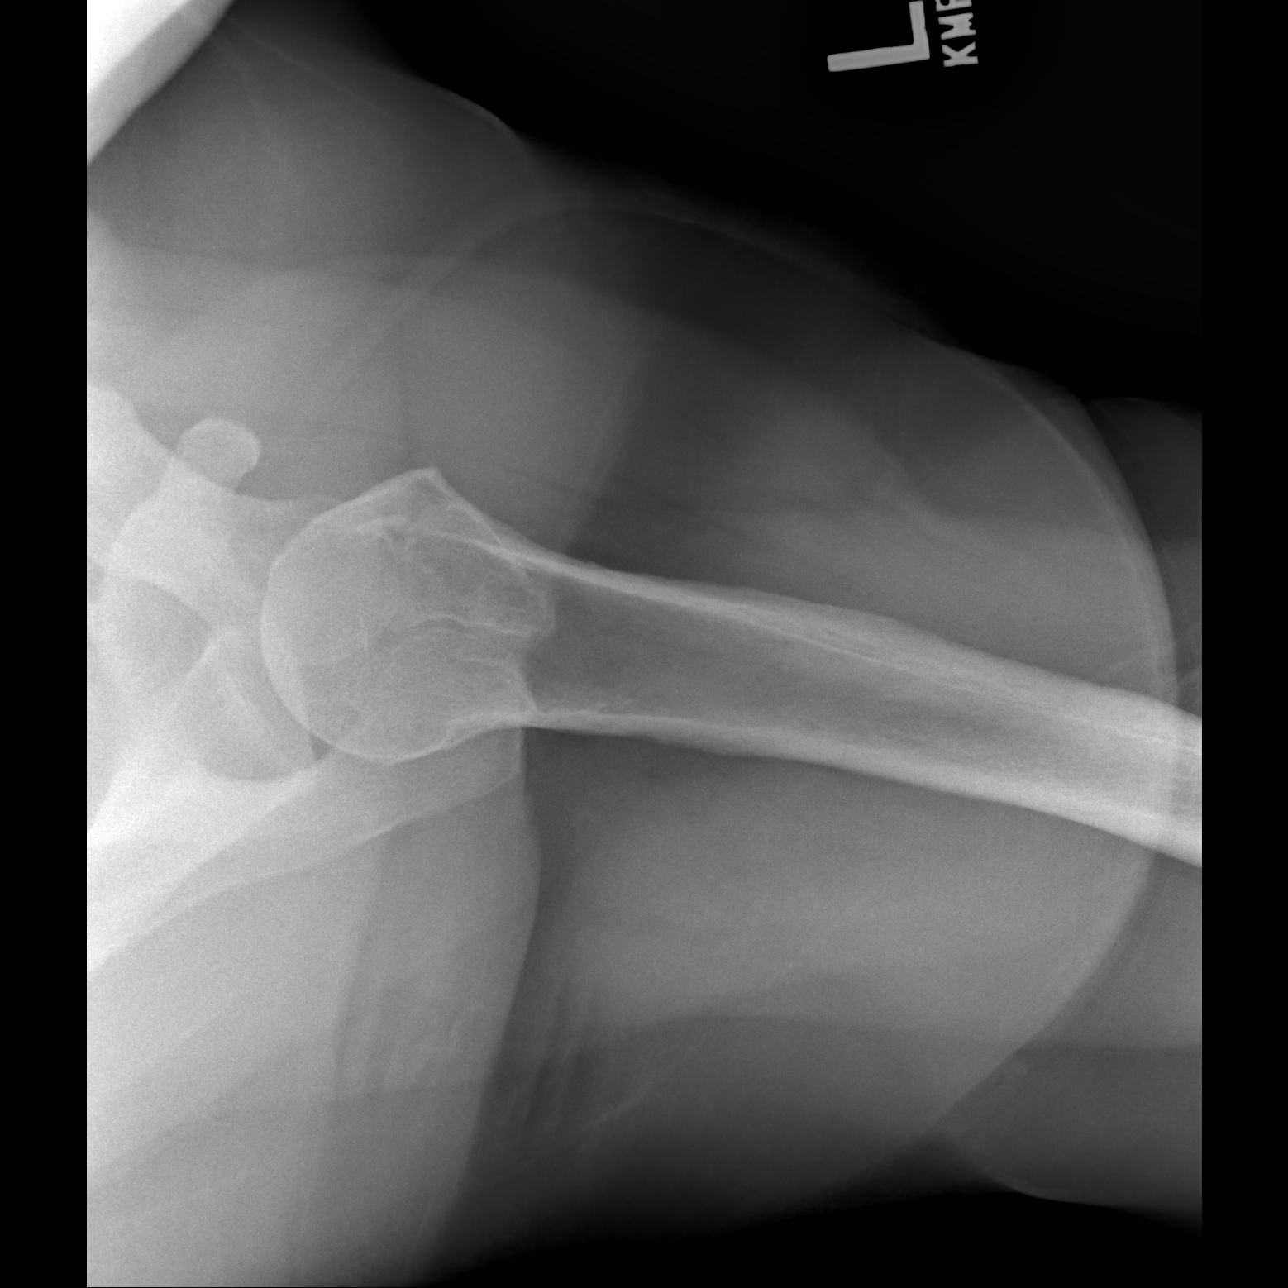

[3 of 3 positions shown; findings below may reference images not displayed]

FINDINGS: Limited views left chest are normal. Degenerative changes at the
left first costochondral junction. Mild degenerative changes in the
shoulder with small osteophytes. No fracture or dislocation.
IMPRESSION: Mild degenerative changes.  No fracture or dislocation.
